# Patient Record
Sex: Male | Born: 1955 | Race: Black or African American | Hispanic: No | Marital: Single | Smoking: Current every day smoker
Health system: Southern US, Community
[De-identification: ages and names within clinical notes are randomized; demographics above are authoritative.]

## PROBLEM LIST (undated history)

## (undated) DIAGNOSIS — R0609 Other forms of dyspnea: Secondary | ICD-10-CM

## (undated) DIAGNOSIS — I10 Essential (primary) hypertension: Secondary | ICD-10-CM

## (undated) DIAGNOSIS — R0600 Dyspnea, unspecified: Secondary | ICD-10-CM

## (undated) DIAGNOSIS — N200 Calculus of kidney: Secondary | ICD-10-CM

## (undated) DIAGNOSIS — E7800 Pure hypercholesterolemia, unspecified: Secondary | ICD-10-CM

## (undated) DIAGNOSIS — F329 Major depressive disorder, single episode, unspecified: Secondary | ICD-10-CM

## (undated) DIAGNOSIS — G8929 Other chronic pain: Secondary | ICD-10-CM

## (undated) DIAGNOSIS — F32A Depression, unspecified: Secondary | ICD-10-CM

## (undated) DIAGNOSIS — E118 Type 2 diabetes mellitus with unspecified complications: Secondary | ICD-10-CM

## (undated) DIAGNOSIS — B1920 Unspecified viral hepatitis C without hepatic coma: Secondary | ICD-10-CM

## (undated) DIAGNOSIS — I209 Angina pectoris, unspecified: Secondary | ICD-10-CM

## (undated) DIAGNOSIS — M545 Low back pain, unspecified: Secondary | ICD-10-CM

## (undated) DIAGNOSIS — E1165 Type 2 diabetes mellitus with hyperglycemia: Secondary | ICD-10-CM

## (undated) DIAGNOSIS — I509 Heart failure, unspecified: Secondary | ICD-10-CM

## (undated) DIAGNOSIS — K7460 Unspecified cirrhosis of liver: Secondary | ICD-10-CM

## (undated) DIAGNOSIS — IMO0002 Reserved for concepts with insufficient information to code with codable children: Secondary | ICD-10-CM

## (undated) HISTORY — DX: Reserved for concepts with insufficient information to code with codable children: IMO0002

---

## 1977-08-22 HISTORY — PX: KIDNEY STONE SURGERY: SHX686

## 2013-03-05 ENCOUNTER — Encounter (HOSPITAL_COMMUNITY): Payer: Self-pay | Admitting: Family Medicine

## 2013-03-05 ENCOUNTER — Emergency Department (HOSPITAL_COMMUNITY): Payer: Medicare Other

## 2013-03-05 ENCOUNTER — Inpatient Hospital Stay (HOSPITAL_COMMUNITY)
Admission: EM | Admit: 2013-03-05 | Discharge: 2013-03-09 | DRG: 433 | Disposition: A | Discharge status: Home / Self Care | Payer: Medicare Other | Attending: Internal Medicine | Admitting: Internal Medicine

## 2013-03-05 DIAGNOSIS — F329 Major depressive disorder, single episode, unspecified: Secondary | ICD-10-CM | POA: Diagnosis present

## 2013-03-05 DIAGNOSIS — M549 Dorsalgia, unspecified: Secondary | ICD-10-CM | POA: Diagnosis present

## 2013-03-05 DIAGNOSIS — I10 Essential (primary) hypertension: Secondary | ICD-10-CM | POA: Diagnosis present

## 2013-03-05 DIAGNOSIS — R600 Localized edema: Secondary | ICD-10-CM

## 2013-03-05 DIAGNOSIS — F32A Depression, unspecified: Secondary | ICD-10-CM

## 2013-03-05 DIAGNOSIS — I129 Hypertensive chronic kidney disease with stage 1 through stage 4 chronic kidney disease, or unspecified chronic kidney disease: Secondary | ICD-10-CM | POA: Diagnosis present

## 2013-03-05 DIAGNOSIS — R1900 Intra-abdominal and pelvic swelling, mass and lump, unspecified site: Secondary | ICD-10-CM

## 2013-03-05 DIAGNOSIS — G894 Chronic pain syndrome: Secondary | ICD-10-CM | POA: Diagnosis present

## 2013-03-05 DIAGNOSIS — K7460 Unspecified cirrhosis of liver: Secondary | ICD-10-CM

## 2013-03-05 DIAGNOSIS — E7800 Pure hypercholesterolemia, unspecified: Secondary | ICD-10-CM | POA: Diagnosis present

## 2013-03-05 DIAGNOSIS — R079 Chest pain, unspecified: Secondary | ICD-10-CM

## 2013-03-05 DIAGNOSIS — R143 Flatulence: Secondary | ICD-10-CM

## 2013-03-05 DIAGNOSIS — E8809 Other disorders of plasma-protein metabolism, not elsewhere classified: Secondary | ICD-10-CM | POA: Diagnosis present

## 2013-03-05 DIAGNOSIS — K7030 Alcoholic cirrhosis of liver without ascites: Principal | ICD-10-CM | POA: Diagnosis present

## 2013-03-05 DIAGNOSIS — D638 Anemia in other chronic diseases classified elsewhere: Secondary | ICD-10-CM | POA: Diagnosis present

## 2013-03-05 DIAGNOSIS — R188 Other ascites: Secondary | ICD-10-CM

## 2013-03-05 DIAGNOSIS — B182 Chronic viral hepatitis C: Secondary | ICD-10-CM | POA: Diagnosis present

## 2013-03-05 DIAGNOSIS — Z23 Encounter for immunization: Secondary | ICD-10-CM

## 2013-03-05 DIAGNOSIS — G8929 Other chronic pain: Secondary | ICD-10-CM

## 2013-03-05 DIAGNOSIS — I5032 Chronic diastolic (congestive) heart failure: Secondary | ICD-10-CM | POA: Diagnosis present

## 2013-03-05 DIAGNOSIS — E118 Type 2 diabetes mellitus with unspecified complications: Secondary | ICD-10-CM

## 2013-03-05 DIAGNOSIS — R140 Abdominal distension (gaseous): Secondary | ICD-10-CM

## 2013-03-05 DIAGNOSIS — N179 Acute kidney failure, unspecified: Secondary | ICD-10-CM

## 2013-03-05 DIAGNOSIS — R0789 Other chest pain: Secondary | ICD-10-CM

## 2013-03-05 DIAGNOSIS — D649 Anemia, unspecified: Secondary | ICD-10-CM

## 2013-03-05 DIAGNOSIS — IMO0002 Reserved for concepts with insufficient information to code with codable children: Secondary | ICD-10-CM

## 2013-03-05 DIAGNOSIS — Z79899 Other long term (current) drug therapy: Secondary | ICD-10-CM

## 2013-03-05 DIAGNOSIS — F17200 Nicotine dependence, unspecified, uncomplicated: Secondary | ICD-10-CM | POA: Diagnosis present

## 2013-03-05 DIAGNOSIS — I509 Heart failure, unspecified: Secondary | ICD-10-CM | POA: Diagnosis present

## 2013-03-05 DIAGNOSIS — Z794 Long term (current) use of insulin: Secondary | ICD-10-CM

## 2013-03-05 DIAGNOSIS — E1165 Type 2 diabetes mellitus with hyperglycemia: Secondary | ICD-10-CM | POA: Diagnosis present

## 2013-03-05 DIAGNOSIS — N183 Chronic kidney disease, stage 3 unspecified: Secondary | ICD-10-CM | POA: Diagnosis present

## 2013-03-05 DIAGNOSIS — IMO0001 Reserved for inherently not codable concepts without codable children: Secondary | ICD-10-CM | POA: Diagnosis present

## 2013-03-05 HISTORY — DX: Unspecified viral hepatitis C without hepatic coma: B19.20

## 2013-03-05 HISTORY — DX: Depression, unspecified: F32.A

## 2013-03-05 HISTORY — DX: Heart failure, unspecified: I50.9

## 2013-03-05 HISTORY — DX: Low back pain: M54.5

## 2013-03-05 HISTORY — DX: Type 2 diabetes mellitus with hyperglycemia: E11.65

## 2013-03-05 HISTORY — DX: Other chronic pain: G89.29

## 2013-03-05 HISTORY — DX: Unspecified cirrhosis of liver: K74.60

## 2013-03-05 HISTORY — DX: Other forms of dyspnea: R06.09

## 2013-03-05 HISTORY — DX: Reserved for concepts with insufficient information to code with codable children: IMO0002

## 2013-03-05 HISTORY — DX: Pure hypercholesterolemia, unspecified: E78.00

## 2013-03-05 HISTORY — DX: Major depressive disorder, single episode, unspecified: F32.9

## 2013-03-05 HISTORY — DX: Essential (primary) hypertension: I10

## 2013-03-05 HISTORY — DX: Type 2 diabetes mellitus with unspecified complications: E11.8

## 2013-03-05 HISTORY — DX: Angina pectoris, unspecified: I20.9

## 2013-03-05 HISTORY — DX: Low back pain, unspecified: M54.50

## 2013-03-05 HISTORY — DX: Calculus of kidney: N20.0

## 2013-03-05 HISTORY — DX: Dyspnea, unspecified: R06.00

## 2013-03-05 LAB — URINALYSIS, ROUTINE W REFLEX MICROSCOPIC
Bilirubin Urine: NEGATIVE
Nitrite: NEGATIVE
Protein, ur: >300 mg/dL — AB
Urobilinogen, UA: 1 mg/dL (ref 0.0–1.0)

## 2013-03-05 LAB — HEPATIC FUNCTION PANEL
AST: 83 U/L — ABNORMAL HIGH (ref 0–37)
Bilirubin, Direct: <0.1 mg/dL (ref 0.0–0.3)
Total Bilirubin: 0.5 mg/dL (ref 0.3–1.2)

## 2013-03-05 LAB — POCT I-STAT TROPONIN I: Troponin i, poc: 0.09 ng/mL (ref 0.00–0.08)

## 2013-03-05 LAB — BASIC METABOLIC PANEL
BUN: 22 mg/dL (ref 6–23)
CO2: 24 mEq/L (ref 19–32)
Chloride: 105 mEq/L (ref 96–112)
GFR calc non Af Amer: 43 mL/min — ABNORMAL LOW (ref >90)
Glucose, Bld: 115 mg/dL — ABNORMAL HIGH (ref 70–99)
Potassium: 3.8 mEq/L (ref 3.5–5.1)
Sodium: 139 mEq/L (ref 135–145)

## 2013-03-05 LAB — CBC
HCT: 33.9 % — ABNORMAL LOW (ref 39.0–52.0)
Hemoglobin: 11.5 g/dL — ABNORMAL LOW (ref 13.0–17.0)
MCHC: 33.9 g/dL (ref 30.0–36.0)
RBC: 4.89 MIL/uL (ref 4.22–5.81)
WBC: 5.4 10*3/uL (ref 4.0–10.5)

## 2013-03-05 LAB — URINE MICROSCOPIC-ADD ON

## 2013-03-05 MED ORDER — ASPIRIN 325 MG PO TABS
325.0000 mg | ORAL_TABLET | Freq: Every day | ORAL | Status: DC
  Administered 2013-03-06 – 2013-03-09 (×4): 325 mg via ORAL
  Filled 2013-03-05 (×4): qty 1

## 2013-03-05 MED ORDER — INSULIN GLARGINE 100 UNIT/ML ~~LOC~~ SOLN
28.0000 [IU] | Freq: Every day | SUBCUTANEOUS | Status: DC
  Administered 2013-03-05 – 2013-03-07 (×3): 28 [IU] via SUBCUTANEOUS
  Filled 2013-03-05 (×4): qty 0.28

## 2013-03-05 MED ORDER — FUROSEMIDE 10 MG/ML IJ SOLN
80.0000 mg | Freq: Once | INTRAMUSCULAR | Status: AC
  Administered 2013-03-05: 80 mg via INTRAVENOUS
  Filled 2013-03-05: qty 8

## 2013-03-05 MED ORDER — ONDANSETRON HCL 4 MG PO TABS: 4.0000 mg | ORAL_TABLET | Freq: Four times a day (QID) | ORAL | Status: DC | PRN

## 2013-03-05 MED ORDER — ONDANSETRON HCL 4 MG/2ML IJ SOLN
4.0000 mg | Freq: Four times a day (QID) | INTRAMUSCULAR | Status: DC | PRN
  Administered 2013-03-06: 4 mg via INTRAVENOUS
  Filled 2013-03-05: qty 2

## 2013-03-05 MED ORDER — HYDRALAZINE HCL 20 MG/ML IJ SOLN
10.0000 mg | Freq: Four times a day (QID) | INTRAMUSCULAR | Status: DC | PRN
  Administered 2013-03-05 – 2013-03-08 (×4): 10 mg via INTRAVENOUS
  Filled 2013-03-05 (×5): qty 1

## 2013-03-05 MED ORDER — CARVEDILOL 25 MG PO TABS
25.0000 mg | ORAL_TABLET | Freq: Two times a day (BID) | ORAL | Status: DC
  Administered 2013-03-06 – 2013-03-09 (×5): 25 mg via ORAL
  Filled 2013-03-05 (×9): qty 1

## 2013-03-05 MED ORDER — FUROSEMIDE 10 MG/ML IJ SOLN
40.0000 mg | Freq: Three times a day (TID) | INTRAMUSCULAR | Status: DC
  Administered 2013-03-05 – 2013-03-06 (×2): 40 mg via INTRAVENOUS
  Filled 2013-03-05 (×5): qty 4

## 2013-03-05 MED ORDER — ASPIRIN 81 MG PO CHEW
324.0000 mg | CHEWABLE_TABLET | Freq: Once | ORAL | Status: AC
  Administered 2013-03-05: 324 mg via ORAL
  Filled 2013-03-05: qty 4

## 2013-03-05 MED ORDER — SPIRONOLACTONE 50 MG PO TABS
50.0000 mg | ORAL_TABLET | Freq: Every day | ORAL | Status: DC
  Administered 2013-03-06: 50 mg via ORAL
  Filled 2013-03-05 (×2): qty 1

## 2013-03-05 MED ORDER — INSULIN ASPART 100 UNIT/ML ~~LOC~~ SOLN
0.0000 [IU] | Freq: Three times a day (TID) | SUBCUTANEOUS | Status: DC
  Administered 2013-03-06: 3 [IU] via SUBCUTANEOUS
  Administered 2013-03-07: 1 [IU] via SUBCUTANEOUS

## 2013-03-05 MED ORDER — ACETAMINOPHEN 650 MG RE SUPP: 650.0000 mg | Freq: Four times a day (QID) | RECTAL | Status: DC | PRN

## 2013-03-05 MED ORDER — PNEUMOCOCCAL VAC POLYVALENT 25 MCG/0.5ML IJ INJ
0.5000 mL | INJECTION | INTRAMUSCULAR | Status: AC
  Administered 2013-03-06: 0.5 mL via INTRAMUSCULAR
  Filled 2013-03-05: qty 0.5

## 2013-03-05 MED ORDER — ACETAMINOPHEN 325 MG PO TABS
650.0000 mg | ORAL_TABLET | Freq: Four times a day (QID) | ORAL | Status: DC | PRN
  Administered 2013-03-06 – 2013-03-08 (×5): 650 mg via ORAL
  Filled 2013-03-05 (×5): qty 2

## 2013-03-05 MED ORDER — INSULIN ASPART 100 UNIT/ML ~~LOC~~ SOLN: 0.0000 [IU] | Freq: Every day | SUBCUTANEOUS | Status: DC

## 2013-03-05 MED ORDER — ENOXAPARIN SODIUM 40 MG/0.4ML ~~LOC~~ SOLN
40.0000 mg | SUBCUTANEOUS | Status: DC
  Administered 2013-03-05 – 2013-03-08 (×2): 40 mg via SUBCUTANEOUS
  Filled 2013-03-05 (×5): qty 0.4

## 2013-03-05 NOTE — H&P (Addendum)
Patient's PCP: Pcp Not In System, PCP in [place], has seen a provider in [place] area.  Chief Complaint: Chest pain and shortness of breath  History of Present Illness: Carl Browning is a 57 y.o. 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  Patient last saw his primary care physician in [place] in February of 2014.  He is visiting [place] because his father died, and he is managing his finances.  He reports that since February of 2014 he is noted increasing abdominal girth with increasing lower extremity edema.  Also over the last 6 months he has had chest pain and shortness of breath with exertion.  In the last 24 hours he has noted chest pain and shortness of breath even at rest.  Yesterday night even though he was seated he was having the symptoms, he was also complaining of being diaphoretic.  Denies any radiating pain to the left arm or jaw.  He became concerned as a result he presented to the emergency department for further evaluation.  In the ER he was found to be in renal failure with creatinine of 1.7, patient is not aware of having renal problems.  Hospitalist service was asked to admit the patient for further care and management.  He denies any fevers, chills, vomiting, abdominal pain, diarrhea, or no blood in stool.  Currently denies any chest pain.  Does admit to having intermittent episodes of generalized headaches and has noted some blurred vision.  Review of Systems: All systems reviewed with the patient and positive as per history of present illness, otherwise all other systems are negative.  Past Medical History  Diagnosis Date  . CHF (congestive heart failure)   . Hypertension   . DM (diabetes mellitus), type 2, uncontrolled with complications   . Cirrhosis   . Chronic back pain    History reviewed. No pertinent past surgical history. Family History   Problem Relation Age of Onset  . Heart attack Mother   . Prostate cancer Father    History   Social History  . Marital Status: Single    Spouse Name: N/A    Number of Children: N/A  . Years of Education: N/A   Occupational History  . Not on file.   Social History Main Topics  . Smoking status: Current Every Day Smoker -- 0.25 packs/day  . Smokeless tobacco: Not on file  . Alcohol Use: No     Comment: Used to drink alcohol heavyly, now only drinks occasionally for occasions.  . Drug Use: No  . Sexually Active: Not on file   Other Topics Concern  . Not on file   Social History Narrative  . No narrative on file   Allergies: Review of patient's allergies indicates no known allergies.  Home Meds: Prior to Admission medications   Medication Sig Start Date End Date Taking? Authorizing Provider  carvedilol (COREG) 25 MG tablet Take 25 mg by mouth 2 (two) times daily with a meal.   Yes Historical Provider, MD  furosemide (LASIX) 40 MG tablet Take 40 mg by mouth daily.   Yes Historical Provider, MD  insulin aspart (NOVOLOG) 100 UNIT/ML injection Inject 10 Units into the skin 3 (three) times daily with meals.   Yes Historical Provider, MD  insulin glargine (LANTUS) 100 UNIT/ML injection Inject 28 Units into the skin at bedtime.   Yes Historical Provider, MD  lisinopril (PRINIVIL,ZESTRIL) 20  MG tablet Take 20 mg by mouth 2 (two) times daily.   Yes Historical Provider, MD  methadone (DOLOPHINE) 10 MG tablet Take 10 mg by mouth every 8 (eight) hours.   Yes Historical Provider, MD  spironolactone (ALDACTONE) 50 MG tablet Take 50 mg by mouth daily.   Yes Historical Provider, MD    Physical Exam: Blood pressure 215/97, pulse 66, temperature 99.2 F (37.3 C), temperature source Oral, resp. rate 17, SpO2 100.00%. General: Awake, Oriented x3, No acute distress. HEENT: EOMI, Moist mucous membranes Neck: Supple CV: S1 and S2 Lungs: Clear to ascultation bilaterally Abdomen: Soft,  Nontender, distended, +bowel sounds. Ext: Good pulses.  2-3+ lower extremity edema. No clubbing or cyanosis noted. Neuro: Cranial Nerves II-XII grossly intact. Has 5/5 motor strength in upper and lower extremities.  Lab results:  Recent Labs  03/05/13 1426  NA 139  K 3.8  CL 105  CO2 24  GLUCOSE 115*  BUN 22  CREATININE 1.70*  CALCIUM 8.6   No results found for this basename: AST, ALT, ALKPHOS, BILITOT, PROT, ALBUMIN,  in the last 72 hours No results found for this basename: LIPASE, AMYLASE,  in the last 72 hours  Recent Labs  03/05/13 1426  WBC 5.4  HGB 11.5*  HCT 33.9*  MCV 69.3*  PLT 166   No results found for this basename: CKTOTAL, CKMB, CKMBINDEX, TROPONINI,  in the last 72 hours No components found with this basename: POCBNP,  No results found for this basename: DDIMER,  in the last 72 hours No results found for this basename: HGBA1C,  in the last 72 hours No results found for this basename: CHOL, HDL, LDLCALC, TRIG, CHOLHDL, LDLDIRECT,  in the last 72 hours No results found for this basename: TSH, T4TOTAL, FREET3, T3FREE, THYROIDAB,  in the last 72 hours No results found for this basename: VITAMINB12, FOLATE, FERRITIN, TIBC, IRON, RETICCTPCT,  in the last 72 hours Imaging results:  Dg Chest 2 View  03/05/2013   *RADIOLOGY REPORT*  Clinical Data: Chest pain  CHEST - 2 VIEW  Comparison: None.  Findings: Cardiomediastinal silhouette is unremarkable.  No acute infiltrate or pleural effusion.  No pulmonary edema.  Bony thorax is unremarkable.  IMPRESSION: No active disease.   Original Report Authenticated By: Natasha Mead, M.D.   Other results: EKG: Normal sinus rhythm with heart rate of 84.  Assessment & Plan by Problem: Chest tightness and shortness of breath Etiology unclear.  Admit the patient to telemetry and cycle cardiac enzymes rule the patient out for acute coronary syndrome.  Patient received a dose of aspirin in the emergency department which will be  continued.  Will get a 2-D echocardiogram for further evaluation.  EKG shows normal sinus rhythm with no ischemic findings.  If patient has persistent chest pain, may need a cardiology evaluation during hospital stay.  Suspect patient's chest tightness and shortness of breath may be related to abdominal distention and girth.  Abdominal distention with history of cirrhosis Will get an abdominal ultrasound in the morning.  If abdominal ultrasound shows ascites, he will need paracentesis for further evaluation.  Will check liver function tests.  Will also check hepatitis panel.  Obtain records from has primary care physician in hospital in [place].  Acute renal failure Uncertain if patient has underlying chronic kidney disease.  Continue to monitor.  Patient has history of nephrolithiasis ordered abdominal ultrasound to evaluate for any obstruction.  Check urine analysis and culture.  Monitor renal function as patient is being  diuresis with Lasix and spironolactone.  Bilateral lower extremity edema Likely related to cirrhosis.  Uncertain if he has diagnosis of congestive heart failure, will get 2-D echocardiogram for further evaluation.  Will get lower extremity Dopplers to rule out DVT.  Patient is being diuresis with Lasix 40 mg IV every 8 hours.  Uncontrolled hypertension Continue home carvedilol.  When necessary hydralazine for systolic blood pressure greater than 160.  Further titration of antihypertensive medications depending on patient's clinical course.  Anemia Likely due to chronic disease.  Check anemia panel in the morning.  Type 2 diabetes uncontrolled with complications Continue home Lantus.  Sliding scale insulin.  Diabetic diet.  Check hemoglobin A1c in the morning.  Chronic back pain Stable.  Has not taken his methadone for a while.  Continue to hold.  Prophylaxis Lovenox.  CODE STATUS Full code.  Disposition Admit the patient to telemetry as inpatient.  Time spent on  admission, talking to the patient, and coordinating care was: 60 mins.  Jong Rickman A, MD 03/05/2013, 8:04 PM

## 2013-03-05 NOTE — ED Notes (Signed)
Per pt sts chest pain x a few days and SOB. sts also that his abdomen is distended. sts burning in chest.

## 2013-03-05 NOTE — ED Provider Notes (Signed)
History    CSN: [id number] Arrival date & time 03/05/13  1416  First MD Initiated Contact with Patient 03/05/13 1517     Chief Complaint  Patient presents with  . Chest Pain   (Consider location/radiation/quality/duration/timing/severity/associated sxs/prior Treatment) HPI Comments: Carl Browning is a 57 y.o. Male who presents for evaluation of abdominal swelling, shortness of breath, and chest pain. Abdominal swelling has been present for greater than 6 months, and is worsening in the last several weeks. He saw his doctor about 2 weeks ago and was told to continue taking Lasix. He had cardiac evaluation about 6 months ago in [place] and was told that his heart was enlarged. He has chronic low back pain and takes methadone for that. He ran out of the methadone 2 weeks ago. He an episode of chest pain 2 weeks ago. That resolved spontaneously. He does not recall having a cardiac catheterization or any specific cardiac stress testing. He has mild dyspnea when lying supine. He also has dyspnea with walking. He denies abdominal pain. His chest pain as a tight feeling in rate at 7-8/10. It started at midnight, after he laid down to go to sleep.  It was present all through the night and did not allow him to sleep fully. No other known modifying factors.  Patient is a 57 y.o. male presenting with chest pain. The history is provided by the patient and a relative.  Chest Pain  Past Medical History  Diagnosis Date  . CHF (congestive heart failure)   . Hypertension   . Diabetes mellitus without complication    History reviewed. No pertinent past surgical history. History reviewed. No pertinent family history. History  Substance Use Topics  . Smoking status: Never Smoker   . Smokeless tobacco: Not on file  . Alcohol Use: No    Review of Systems  Cardiovascular: Positive for chest pain.  All other systems reviewed and are negative.    Allergies  Review of patient's allergies indicates no known  allergies.  Home Medications   Current Outpatient Rx  Name  Route  Sig  Dispense  Refill  . carvedilol (COREG) 25 MG tablet   Oral   Take 25 mg by mouth 2 (two) times daily with a meal.         . furosemide (LASIX) 40 MG tablet   Oral   Take 40 mg by mouth daily.         . insulin aspart (NOVOLOG) 100 UNIT/ML injection   Subcutaneous   Inject 10 Units into the skin 3 (three) times daily with meals.         . insulin glargine (LANTUS) 100 UNIT/ML injection   Subcutaneous   Inject 28 Units into the skin at bedtime.         Marland Kitchen lisinopril (PRINIVIL,ZESTRIL) 20 MG tablet   Oral   Take 20 mg by mouth 2 (two) times daily.         . methadone (DOLOPHINE) 10 MG tablet   Oral   Take 10 mg by mouth every 8 (eight) hours.         Marland Kitchen spironolactone (ALDACTONE) 50 MG tablet   Oral   Take 50 mg by mouth daily.          BP 174/96  Pulse 64  Temp(Src) 99.2 F (37.3 C) (Oral)  Resp 19  SpO2 100% Physical Exam  Nursing note and vitals reviewed. Constitutional: He is oriented to person, place, and time. He appears well-developed.  Appears older than stated age  HENT:  Head: Normocephalic and atraumatic.  Right Ear: External ear normal.  Left Ear: External ear normal.  Eyes: Conjunctivae and EOM are normal. Pupils are equal, round, and reactive to light. Right eye exhibits no discharge. Left eye exhibits no discharge. No scleral icterus.  Neck: Normal range of motion and phonation normal. Neck supple.  Cardiovascular: Normal rate, regular rhythm, normal heart sounds and intact distal pulses.   Pulmonary/Chest: Effort normal. He has rales (Bilateral bases). He exhibits no bony tenderness.  Abdominal: Soft. Normal appearance. He exhibits distension. He exhibits no mass. There is no tenderness. There is no guarding.  Musculoskeletal: Normal range of motion. He exhibits edema (2-3+ bilateral lower extemities).  Neurological: He is alert and oriented to person, place, and  time. He has normal strength. No cranial nerve deficit or sensory deficit. He exhibits normal muscle tone. Coordination normal.  Skin: Skin is warm, dry and intact.  Psychiatric: He has a normal mood and affect. His behavior is normal. Judgment and thought content normal.    ED Course  Procedures (including critical care time)  Medications  aspirin chewable tablet 324 mg (324 mg Oral Given 03/05/13 1532)  furosemide (LASIX) injection 80 mg (80 mg Intravenous Given 03/05/13 1814)    Patient Vitals for the past 24 hrs:  BP Temp Temp src Pulse Resp SpO2  03/05/13 1900 174/96 mmHg - - 64 19 100 %  03/05/13 1845 197/87 mmHg - - 70 14 100 %  03/05/13 1830 220/108 mmHg - - 70 19 100 %  03/05/13 1815 201/92 mmHg - - 66 18 100 %  03/05/13 1800 184/84 mmHg - - 67 16 100 %  03/05/13 1745 199/92 mmHg - - 65 11 99 %  03/05/13 1730 205/101 mmHg - - 63 13 99 %  03/05/13 1645 179/87 mmHg - - 59 16 100 %  03/05/13 1630 184/80 mmHg - - 61 16 100 %  03/05/13 1615 194/88 mmHg - - 64 19 99 %  03/05/13 1600 185/93 mmHg - - 60 12 100 %  03/05/13 1545 201/91 mmHg - - 68 14 99 %  03/05/13 1535 203/97 mmHg - - 68 17 100 %  03/05/13 1425 148/85 mmHg 99.2 F (37.3 C) Oral 87 28 99 %      Date: 03/05/13  Rate: 84  Rhythm: normal sinus rhythm  QRS Axis: normal  PR and QT Intervals: normal  ST/T Wave abnormalities: normal  PR and QRS Conduction Disutrbances:none  Narrative Interpretation:   Old EKG Reviewed: none available  7:17 PM-Consult complete with Dr. Betti Cruz. Patient case explained and discussed. He agrees to admit patient for further evaluation and treatment. Call ended at 1927  Labs Reviewed  CBC - Abnormal; Notable for the following:    Hemoglobin 11.5 (*)    HCT 33.9 (*)    MCV 69.3 (*)    MCH 23.5 (*)    All other components within normal limits  BASIC METABOLIC PANEL - Abnormal; Notable for the following:    Glucose, Bld 115 (*)    Creatinine, Ser 1.70 (*)    GFR calc non Af Amer 43  (*)    GFR calc Af Amer 50 (*)    All other components within normal limits  PRO B NATRIURETIC PEPTIDE - Abnormal; Notable for the following:    Pro B Natriuretic peptide (BNP) 1147.0 (*)    All other components within normal limits  POCT I-STAT TROPONIN I - Abnormal; Notable  for the following:    Troponin i, poc 0.09 (*)    All other components within normal limits  HEPATIC FUNCTION PANEL  POCT I-STAT TROPONIN I   Dg Chest 2 View  03/05/2013   *RADIOLOGY REPORT*  Clinical Data: Chest pain  CHEST - 2 VIEW  Comparison: None.  Findings: Cardiomediastinal silhouette is unremarkable.  No acute infiltrate or pleural effusion.  No pulmonary edema.  Bony thorax is unremarkable.  IMPRESSION: No active disease.   Original Report Authenticated By: Natasha Mead, M.D.   1. Chest pain   2. Abdominal swelling   3. Ascites     MDM  Nonspecific chest pain with abdominal pain and swelling. Chest pain is nonspecific and may be related to his ascites is secondary to his cirrhosis. Troponin x2 are borderline positive but unchanged. Doubt ACS, PE, or pneumonia. He has mild anemia. BNP elevation, is nonspecific.  Plan: Admit  Flint Melter, MD 03/05/13 [phone]

## 2013-03-05 NOTE — ED Notes (Signed)
Report given to floor.  Pt transported via stretcher and monitor to floor.  Nadn. bp is decreased now.

## 2013-03-06 ENCOUNTER — Inpatient Hospital Stay (HOSPITAL_COMMUNITY): Payer: Medicare Other

## 2013-03-06 DIAGNOSIS — R188 Other ascites: Secondary | ICD-10-CM

## 2013-03-06 DIAGNOSIS — M7989 Other specified soft tissue disorders: Secondary | ICD-10-CM

## 2013-03-06 DIAGNOSIS — I517 Cardiomegaly: Secondary | ICD-10-CM

## 2013-03-06 DIAGNOSIS — R0602 Shortness of breath: Secondary | ICD-10-CM

## 2013-03-06 LAB — CBC
MCH: 23.2 pg — ABNORMAL LOW (ref 26.0–34.0)
Platelets: 137 10*3/uL — ABNORMAL LOW (ref 150–400)
RBC: 4.69 MIL/uL (ref 4.22–5.81)
WBC: 5.6 10*3/uL (ref 4.0–10.5)

## 2013-03-06 LAB — GLUCOSE, CAPILLARY
Glucose-Capillary: 207 mg/dL — ABNORMAL HIGH (ref 70–99)
Glucose-Capillary: 117 mg/dL — ABNORMAL HIGH (ref 70–99)
Glucose-Capillary: 158 mg/dL — ABNORMAL HIGH (ref 70–99)

## 2013-03-06 LAB — RETICULOCYTES: RBC.: 4.69 MIL/uL (ref 4.22–5.81)

## 2013-03-06 LAB — IRON AND TIBC
Saturation Ratios: 16 % — ABNORMAL LOW (ref 20–55)
UIBC: 244 ug/dL (ref 125–400)

## 2013-03-06 LAB — COMPREHENSIVE METABOLIC PANEL
Alkaline Phosphatase: 108 U/L (ref 39–117)
BUN: 22 mg/dL (ref 6–23)
Calcium: 8.3 mg/dL — ABNORMAL LOW (ref 8.4–10.5)
Creatinine, Ser: 1.7 mg/dL — ABNORMAL HIGH (ref 0.50–1.35)
GFR calc Af Amer: 50 mL/min — ABNORMAL LOW (ref >90)
Glucose, Bld: 88 mg/dL (ref 70–99)
Total Protein: 6 g/dL (ref 6.0–8.3)

## 2013-03-06 LAB — HEMOGLOBIN A1C
Hgb A1c MFr Bld: 5.6 % (ref <5.7)
Mean Plasma Glucose: 114 mg/dL (ref <117)

## 2013-03-06 LAB — PROTIME-INR
INR: 1.02 (ref 0.00–1.49)
Prothrombin Time: 13.2 seconds (ref 11.6–15.2)

## 2013-03-06 LAB — FOLATE: Folate: 11.9 ng/mL

## 2013-03-06 MED ORDER — METHADONE HCL 10 MG PO TABS
10.0000 mg | ORAL_TABLET | Freq: Two times a day (BID) | ORAL | Status: DC
  Administered 2013-03-06 – 2013-03-09 (×6): 10 mg via ORAL
  Filled 2013-03-06 (×6): qty 1

## 2013-03-06 MED ORDER — FUROSEMIDE 10 MG/ML IJ SOLN
40.0000 mg | Freq: Two times a day (BID) | INTRAMUSCULAR | Status: DC
  Filled 2013-03-06 (×2): qty 4

## 2013-03-06 NOTE — Progress Notes (Signed)
VASCULAR LAB PRELIMINARY  PRELIMINARY  PRELIMINARY  PRELIMINARY  Bilateral lower extremity venous duplex  completed.    Preliminary report:  Bilateral:  No evidence of DVT, superficial thrombosis, or Baker's Cyst.    Kamsiyochukwu Spickler, RVT 03/06/2013, 11:32 AM

## 2013-03-06 NOTE — Progress Notes (Signed)
PATIENT DETAILS Name: Carl Browning Age: 57 y.o. Sex: male Date of Birth: 08-Jul-1956 Admit Date: 03/05/2013 Admitting Physician Cristal Ford, MD PCP:Pcp Not In System  Subjective: No more chest pain, did have mild headache earlier this am-this has now resolved. Claims he had alcoholic cirrhosis.  Assessment/Plan: Principal Problem:   Chest tightness -resolved -troponins X 3 neg -Echo- no wall motion wall abnormalities, Doppler's neg for DVT. Without hypoxia or tachycardia-low suspicion for PE. Ambulated in room today-with no recurrence of pain -doubt this was cardiac-suspect further work up can be done in the outpatient setting -change ASA to 81 mg  Active Problems: Ascites -continue with diuretics -likely 2/2 to underlying cirrhosis -claims baseline weight approx 220 lbs-weight on admission 247 lbs -decrease lasix to 40 mg BID -continue with Aldactone -will order for paracentesis in am  Renal Failure -not sure whether this acute or chronic -renal function stable compared to admit -monitor lytes -will try and get records from his physician's in [place]-he does not recollect their names-he will ask his family members to get [place] the names    DM (diabetes mellitus), type 2 -A1C 5.6 -CBG's stable -c/w Lantus 28 units daily, c/w SSI    Cirrhosis -claims to have a hx of underlying ETOH cirrhosis  Lower Ext Edema -diuresis underway with Lasix, aldactone -related to ascites and hypoalbuminemia  Diastolic CHF -compensated-suspect ascites and edema are from cirrhosis -continue with Coreg and Lasix -daily weights  -strict I&O's  HTN -c/w Coreg, Lasix and Aldactone  Chronic Pain Syndrome -hx of chronic back pain -claims to be on Methadone 10 mg TID for last 14 yrs-resume at BID dosing -obtain records from Dr Ree Kida in [facility]-he last saw him in March of this year   Disposition: Remain inpatient  DVT Prophylaxis: Prophylactic Lovenox   Code Status: Full code    Family Communication None  Procedures:  None  CONSULTS:  None   MEDICATIONS: Scheduled Meds: . aspirin  325 mg Oral Daily  . carvedilol  25 mg Oral BID WC  . enoxaparin (LOVENOX) injection  40 mg Subcutaneous Q24H  . furosemide  40 mg Intravenous Q8H  . insulin aspart  0-5 Units Subcutaneous QHS  . insulin aspart  0-9 Units Subcutaneous TID WC  . insulin glargine  28 Units Subcutaneous QHS  . spironolactone  50 mg Oral Daily   Continuous Infusions:  PRN Meds:.acetaminophen, acetaminophen, hydrALAZINE, ondansetron (ZOFRAN) IV, ondansetron  Antibiotics: Anti-infectives   None       PHYSICAL EXAM: Vital signs in last 24 hours: Filed Vitals:   03/06/13 0114 03/06/13 0510 03/06/13 0659 03/06/13 1540  BP: 169/84 176/82 141/78 151/76  Pulse:  63 72 54  Temp:  98.4 F (36.9 C)  98.3 F (36.8 C)  TempSrc:  Oral  Oral  Resp:  18  18  Height:      Weight:  110.95 kg (244 lb 9.6 oz)    SpO2:  99% 99% 96%    Weight change:  Filed Weights   03/05/13 2118 03/06/13 0510  Weight: 112.311 kg (247 lb 9.6 oz) 110.95 kg (244 lb 9.6 oz)   Body mass index is 34.13 kg/(m^2).   Gen Exam: Awake and alert with clear speech.   Neck: Supple, No JVD.   Chest: B/L Clear.   CVS: S1 S2 Regular, no murmurs.  Abdomen: soft, BS +, non tender,  Distended with shifting dullness. Extremities: 2+ edema, lower extremities warm to touch. Neurologic: Non Focal.   Skin: No Rash.  Wounds: N/A.    Intake/Output from previous day:  Intake/Output Summary (Last 24 hours) at 03/06/13 1553 Last data filed at 03/06/13 1541  Gross per 24 hour  Intake     60 ml  Output    925 ml  Net   -865 ml     LAB RESULTS: CBC  Recent Labs Lab 03/05/13 1426 03/06/13 0210  WBC 5.4 5.6  HGB 11.5* 10.9*  HCT 33.9* 33.0*  PLT 166 137*  MCV 69.3* 70.4*  MCH 23.5* 23.2*  MCHC 33.9 33.0  RDW 15.2 15.5    Chemistries   Recent Labs Lab 03/05/13 1426 03/06/13 0210  NA 139 141  K 3.8 3.5   CL 105 106  CO2 24 30  GLUCOSE 115* 88  BUN 22 22  CREATININE 1.70* 1.70*  CALCIUM 8.6 8.3*    CBG:  Recent Labs Lab 03/05/13 2156 03/06/13 0828 03/06/13 1228  GLUCAP 129* 82 207*    GFR Estimated Creatinine Clearance: 61.5 ml/min (by C-G formula based on Cr of 1.7).  Coagulation profile  Recent Labs Lab 03/06/13 0210  INR 1.02    Cardiac Enzymes  Recent Labs Lab 03/05/13 2029 03/06/13 0210  TROPONINI <0.30 <0.30    No components found with this basename: POCBNP,  No results found for this basename: DDIMER,  in the last 72 hours  Recent Labs  03/06/13 0210  HGBA1C 5.6   No results found for this basename: CHOL, HDL, LDLCALC, TRIG, CHOLHDL, LDLDIRECT,  in the last 72 hours No results found for this basename: TSH, T4TOTAL, FREET3, T3FREE, THYROIDAB,  in the last 72 hours  Recent Labs  03/06/13 0210  VITAMINB12 655  FOLATE 11.9  FERRITIN 67  TIBC 292  IRON 48  RETICCTPCT 1.5   No results found for this basename: LIPASE, AMYLASE,  in the last 72 hours  Urine Studies No results found for this basename: UACOL, UAPR, USPG, UPH, UTP, UGL, UKET, UBIL, UHGB, UNIT, UROB, ULEU, UEPI, UWBC, URBC, UBAC, CAST, CRYS, UCOM, BILUA,  in the last 72 hours  MICROBIOLOGY: No results found for this or any previous visit (from the past 240 hour(s)).  RADIOLOGY STUDIES/RESULTS: Dg Chest 2 View  03/05/2013   *RADIOLOGY REPORT*  Clinical Data: Chest pain  CHEST - 2 VIEW  Comparison: None.  Findings: Cardiomediastinal silhouette is unremarkable.  No acute infiltrate or pleural effusion.  No pulmonary edema.  Bony thorax is unremarkable.  IMPRESSION: No active disease.   Original Report Authenticated By: Natasha Mead, M.D.   US Abdomen Complete  03/06/2013   *RADIOLOGY REPORT*  Clinical Data:  Cirrhosis and acute renal failure.  COMPLETE ABDOMINAL ULTRASOUND  Comparison:  None.  Findings:  Gallbladder:  Numerous mobile echogenic shadowing gallstones are present.  Wall  thickness is within normal limits at 3 mm.  There is no sonographic Murphy's sign.  Common bile duct:  The common bile duct is at the limits of normal in diameter at 6.3 mm.  Liver:  The liver is shrunken with multiple echogenic foci.  No discrete mass lesion is present.  The edges are nodular, compatible with end-stage cirrhosis.  IVC:  Appears normal.  Pancreas:  Although the pancreas is difficult to visualize in its entirety, no focal pancreatic abnormality is identified.  Spleen:  The spleen is mildly enlarged, measuring 1.6 cm in maximal length.  No focal lesions are present.  The right kidney is of normal size and echo texture.  It measures 12.5 cm maximally.  Multiple shadowing  calcifications are present, the largest measuring 7 mm.  There is no evidence for obstruction.  Right Kidney:  Measures  Left Kidney:  The left kidney is of normal size and echotexture measuring 12.1 cm maximally.  Multiple calcifications are noted without evidence for obstruction.  The largest stone is 8 mm.  Abdominal aorta:  The distal aorta is not visualized.  Minimal aneurysmal dilation is evident with maximal measured diameter of 3.1 cm.  Moderate free fluid is present within the peritoneal cavity.  IMPRESSION:  1.  Cirrhotic changes of the liver with scattered increased foci of echogenicity, overall decreased size, and a nodular edge. 2.  Moderate abdominal ascites. 3.  Enlarged spleen without a discrete lesion. 4.  Bilateral nephrolithiasis without obstruction. 5.  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   Original Report Authenticated By: Marin Roberts, M.D.    Jeoffrey Massed, MD  [facility] Pager:[phone]  If 7PM-7AM, please contact night-coverage [email or web address] Password [email or web address] 03/06/2013, 3:53 PM   LOS: 1 day

## 2013-03-06 NOTE — Progress Notes (Signed)
  Echocardiogram 2D Echocardiogram has been performed.  Carl Browning 03/06/2013, 11:52 AM

## 2013-03-06 NOTE — Care Management Note (Signed)
    Page 1 of 1   03/11/2013     11:13:30 AM   CARE MANAGEMENT NOTE 03/11/2013  Patient:  Carl Browning, Carl Browning   Account Number:  [id number]  Date Initiated:  03/06/2013  Documentation initiated by:  Letha Cape  Subjective/Objective Assessment:   dx chest tightness, ? ascites, hypertension, cirrhosis, ? chf  admit- lives with son right now, but is from [place] and wants to go back.     Action/Plan:   Anticipated DC Date:  03/09/2013   Anticipated DC Plan:  HOME/SELF CARE      DC Planning Services  CM consult      Choice offered to / List presented to:             Status of service:  Completed, signed off Medicare Important Message given?   (If response is "NO", the following Medicare IM given date fields will be blank) Date Medicare IM given:   Date Additional Medicare IM given:    Discharge Disposition:  HOME/SELF CARE  Per UR Regulation:  Reviewed for med. necessity/level of care/duration of stay  If discussed at Long Length of Stay Meetings, dates discussed:    Comments:  03/11/13 11:07 Letha Cape RN, BSN [phone] patient dc to home.  03/06/13 15:40 Letha Cape RN, BSN [phone] patient lives with son, but he is from [place], patient has medication coverage thru medicare.  Patient has transportation at dc.  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  Informed MD of this information.

## 2013-03-06 NOTE — Progress Notes (Signed)
Pt arrived from ED via stretcher. Pt was oriented to room and safety video has been watched. Pt has an elevated bp. RN will continue to monitor pt. Pt was just given 10 mg of hydralazine IV in ED. Pt's abdominal girth measuring about 51

## 2013-03-07 LAB — BASIC METABOLIC PANEL
CO2: 28 mEq/L (ref 19–32)
Chloride: 106 mEq/L (ref 96–112)
Creatinine, Ser: 2 mg/dL — ABNORMAL HIGH (ref 0.50–1.35)
Sodium: 140 mEq/L (ref 135–145)

## 2013-03-07 LAB — GLUCOSE, CAPILLARY: Glucose-Capillary: 100 mg/dL — ABNORMAL HIGH (ref 70–99)

## 2013-03-07 LAB — URINE CULTURE: Culture: NO GROWTH

## 2013-03-07 LAB — HEPATITIS PANEL, ACUTE: HCV Ab: REACTIVE — AB

## 2013-03-07 MED ORDER — SERTRALINE HCL 25 MG PO TABS
25.0000 mg | ORAL_TABLET | Freq: Every day | ORAL | Status: DC
  Administered 2013-03-07 – 2013-03-09 (×3): 25 mg via ORAL
  Filled 2013-03-07 (×3): qty 1

## 2013-03-07 NOTE — Progress Notes (Signed)
After giving pt morning medications, pt began to talk about feet being in pain and stated "I can't live with it anymore."  Inquired if pt was having thoughts of suicide.  Pt stated "I don't want to be a burden to my family.  I've seen friends lose their limbs and their legs.  And the pain doesn't get better."  Asked if pt had attempted suicide in the past.  Pt stated "I tried once.  I didn't take my medications once and let my blood pressure get high, but it didn't work."  Informed Dr. Jerral Ralph.  Will continue to monitor.

## 2013-03-07 NOTE — Progress Notes (Signed)
PATIENT DETAILS Name: Carl Browning Age: 57 y.o. Sex: male Date of Birth: 02/29/1956 Admit Date: 03/05/2013 Admitting Physician Cristal Ford, MD PCP:Pcp Not In System  Subjective: No more chest pain, did have mild headache earlier this am-this has now resolved. Claims he had alcoholic cirrhosis.  Assessment/Plan: Principal Problem:   Chest tightness -resolved -troponins X 3 neg -Echo- no wall motion wall abnormalities, Doppler's neg for DVT. Without hypoxia or tachycardia-low suspicion for PE. Ambulated in room today-with no recurrence of pain -doubt this was cardiac-suspect further work up can be done in the outpatient setting -change ASA to 81 mg  Active Problems: Ascites -increased distention on 7/17 -2/2 to underlying cirrhosis -claims baseline weight approx 220 lbs-weight 7/17 is 249 lbs -lasix/aldactone discontinued due to increasing creatinine -will order for paracentesis in am -2 gm sodium diet  Renal Failure -not sure whether this acute or chronic -renal function worsening (creatinine at 2.0 on 7/17) -monitor lytes -will try and get records from his physician's in [place]-he does not recollect their names-he will ask his family members to get [place] the names.  PCP in [facility] is Dr. Garner Nash per Pt.    DM (diabetes mellitus), type 2 -A1C 5.6 -CBG's stable -c/w Lantus 28 units daily, c/w SSI    Cirrhosis -claims to have a hx of underlying ETOH cirrhosis  Lower Ext Edema -improved with diuresis -related to ascites and hypoalbuminemia  Diastolic CHF -compensated-suspect ascites and edema are from cirrhosis -continue with Coreg  -daily weights  -strict I&O's  HTN -c/w Coreg, Lasix and Aldactone  Chronic Pain Syndrome -hx of chronic back pain -claims to be on Methadone 10 mg TID for last 14 yrs-resume at BID dosing -obtain records from Dr Ree Kida in [facility]-he last saw him in March of this year  Suicidal Ideations -patient discussing  previous attempts to commit suicide with RN, comments - I don't want to live anymore -Psych consult - NP notes depression, but SI are unlikely.  Offered inpatient therapy - patient refused.  Recommended SSRI and outpatient therapy.  Disposition: Remain inpatient  DVT Prophylaxis: Prophylactic Lovenox   Code Status: Full code   Family Communication None  Procedures:  None  CONSULTS:  None   MEDICATIONS: Scheduled Meds: . aspirin  325 mg Oral Daily  . carvedilol  25 mg Oral BID WC  . enoxaparin (LOVENOX) injection  40 mg Subcutaneous Q24H  . insulin aspart  0-5 Units Subcutaneous QHS  . insulin aspart  0-9 Units Subcutaneous TID WC  . insulin glargine  28 Units Subcutaneous QHS  . methadone  10 mg Oral Q12H  . sertraline  25 mg Oral Daily   Continuous Infusions:  PRN Meds:.acetaminophen, acetaminophen, hydrALAZINE, ondansetron (ZOFRAN) IV, ondansetron  Antibiotics: Anti-infectives   None       PHYSICAL EXAM: Vital signs in last 24 hours: Filed Vitals:   03/06/13 1540 03/07/13 0500 03/07/13 0617 03/07/13 0854  BP: 151/76  169/78 180/78  Pulse: 54  61 63  Temp: 98.3 F (36.8 C)  100 F (37.8 C)   TempSrc: Oral  Oral   Resp: 18  18   Height:      Weight:  113.49 kg (250 lb 3.2 oz) 113.172 kg (249 lb 8 oz)   SpO2: 96%  98%     Weight change: 1.179 kg (2 lb 9.6 oz) Filed Weights   03/06/13 0510 03/07/13 0500 03/07/13 0617  Weight: 110.95 kg (244 lb 9.6 oz) 113.49 kg (250 lb 3.2 oz)  113.172 kg (249 lb 8 oz)   Body mass index is 34.81 kg/(m^2).   Gen Exam: Awake and alert with clear speech.   Neck: Supple, No JVD.   Chest: B/L Clear.   CVS: S1 S2 Regular, no murmurs.  Abdomen: firm, distended,  BS +, non tender,  Distended with shifting dullness. Extremities: 2+ edema, lower extremities warm to touch. Neurologic: Non Focal.   Skin: No Rash.   Wounds: N/A.    Intake/Output from previous day:  Intake/Output Summary (Last 24 hours) at 03/07/13  1216 Last data filed at 03/07/13 0600  Gross per 24 hour  Intake    180 ml  Output      0 ml  Net    180 ml     LAB RESULTS: CBC  Recent Labs Lab 03/05/13 1426 03/06/13 0210  WBC 5.4 5.6  HGB 11.5* 10.9*  HCT 33.9* 33.0*  PLT 166 137*  MCV 69.3* 70.4*  MCH 23.5* 23.2*  MCHC 33.9 33.0  RDW 15.2 15.5    Chemistries   Recent Labs Lab 03/05/13 1426 03/06/13 0210 03/07/13 0500  NA 139 141 140  K 3.8 3.5 3.8  CL 105 106 106  CO2 24 30 28   GLUCOSE 115* 88 115*  BUN 22 22 25*  CREATININE 1.70* 1.70* 2.00*  CALCIUM 8.6 8.3* 8.1*    CBG:  Recent Labs Lab 03/06/13 1228 03/06/13 1704 03/06/13 2236 03/07/13 0759 03/07/13 1155  GLUCAP 207* 117* 158* 100* 129*     Coagulation profile  Recent Labs Lab 03/06/13 0210  INR 1.02    Cardiac Enzymes  Recent Labs Lab 03/05/13 2029 03/06/13 0210  TROPONINI <0.30 <0.30     Recent Labs  03/06/13 0210  HGBA1C 5.6    Recent Labs  03/06/13 0210  VITAMINB12 655  FOLATE 11.9  FERRITIN 67  TIBC 292  IRON 48  RETICCTPCT 1.5    MICROBIOLOGY: Recent Results (from the past 240 hour(s))  URINE CULTURE     Status: None   Collection Time    03/05/13  8:00 PM      Result Value Range Status   Specimen Description URINE, CLEAN CATCH   Final   Special Requests NONE   Final   Culture  Setup Time 03/06/2013 06:38   Final   Colony Count NO GROWTH   Final   Culture NO GROWTH   Final   Report Status 03/07/2013 FINAL   Final    RADIOLOGY STUDIES/RESULTS: Dg Chest 2 View  03/05/2013   *RADIOLOGY REPORT*  Clinical Data: Chest pain  CHEST - 2 VIEW  Comparison: None.  Findings: Cardiomediastinal silhouette is unremarkable.  No acute infiltrate or pleural effusion.  No pulmonary edema.  Bony thorax is unremarkable.  IMPRESSION: No active disease.   Original Report Authenticated By: Natasha Mead, M.D.   US Abdomen Complete  03/06/2013   *RADIOLOGY REPORT*  Clinical Data:  Cirrhosis and acute renal failure.   COMPLETE ABDOMINAL ULTRASOUND  Comparison:  None.  Findings:  Gallbladder:  Numerous mobile echogenic shadowing gallstones are present.  Wall thickness is within normal limits at 3 mm.  There is no sonographic Murphy's sign.  Common bile duct:  The common bile duct is at the limits of normal in diameter at 6.3 mm.  Liver:  The liver is shrunken with multiple echogenic foci.  No discrete mass lesion is present.  The edges are nodular, compatible with end-stage cirrhosis.  IVC:  Appears normal.  Pancreas:  Although the pancreas  is difficult to visualize in its entirety, no focal pancreatic abnormality is identified.  Spleen:  The spleen is mildly enlarged, measuring 1.6 cm in maximal length.  No focal lesions are present.  The right kidney is of normal size and echo texture.  It measures 12.5 cm maximally.  Multiple shadowing calcifications are present, the largest measuring 7 mm.  There is no evidence for obstruction.  Right Kidney:  Measures  Left Kidney:  The left kidney is of normal size and echotexture measuring 12.1 cm maximally.  Multiple calcifications are noted without evidence for obstruction.  The largest stone is 8 mm.  Abdominal aorta:  The distal aorta is not visualized.  Minimal aneurysmal dilation is evident with maximal measured diameter of 3.1 cm.  Moderate free fluid is present within the peritoneal cavity.  IMPRESSION:  1.  Cirrhotic changes of the liver with scattered increased foci of echogenicity, overall decreased size, and a nodular edge. 2.  Moderate abdominal ascites. 3.  Enlarged spleen without a discrete lesion. 4.  Bilateral nephrolithiasis without obstruction. 5.  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   Original Report Authenticated By: Marin Roberts, M.D.    [facility] Pager:[phone]  If 7PM-7AM, please contact night-coverage [email or web address] Password [email or web address] 03/07/2013, 12:16 PM   LOS: 2 days      Attending Patient seen and examined, agree with the above assessment and plan. Hold diuretics today, and recheck lytes in am-suspect needs a paracentesis tomorrow. Seen by psych this am-not actively suicidal-started on Zoloft  S Abaigeal Moomaw
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
## 2013-03-08 ENCOUNTER — Inpatient Hospital Stay (HOSPITAL_COMMUNITY): Payer: Medicare Other

## 2013-03-08 DIAGNOSIS — F329 Major depressive disorder, single episode, unspecified: Secondary | ICD-10-CM

## 2013-03-08 DIAGNOSIS — G8929 Other chronic pain: Secondary | ICD-10-CM

## 2013-03-08 DIAGNOSIS — M549 Dorsalgia, unspecified: Secondary | ICD-10-CM

## 2013-03-08 LAB — GLUCOSE, CAPILLARY
Glucose-Capillary: 100 mg/dL — ABNORMAL HIGH (ref 70–99)
Glucose-Capillary: 113 mg/dL — ABNORMAL HIGH (ref 70–99)
Glucose-Capillary: 98 mg/dL (ref 70–99)

## 2013-03-08 LAB — ALBUMIN, FLUID (OTHER): Albumin, Fluid: 0.6 g/dL

## 2013-03-08 LAB — BODY FLUID CELL COUNT WITH DIFFERENTIAL
Eos, Fluid: 0 %
Lymphs, Fluid: 64 %
Neutrophil Count, Fluid: 6 % (ref 0–25)

## 2013-03-08 LAB — BASIC METABOLIC PANEL
BUN: 28 mg/dL — ABNORMAL HIGH (ref 6–23)
Calcium: 8.3 mg/dL — ABNORMAL LOW (ref 8.4–10.5)
GFR calc non Af Amer: 38 mL/min — ABNORMAL LOW (ref >90)
Glucose, Bld: 78 mg/dL (ref 70–99)
Sodium: 139 mEq/L (ref 135–145)

## 2013-03-08 MED ORDER — FUROSEMIDE 40 MG PO TABS
40.0000 mg | ORAL_TABLET | Freq: Every day | ORAL | Status: DC
  Administered 2013-03-08: 40 mg via ORAL
  Filled 2013-03-08 (×2): qty 1

## 2013-03-08 MED ORDER — ALBUMIN HUMAN 25 % IV SOLN
50.0000 g | Freq: Once | INTRAVENOUS | Status: AC
  Administered 2013-03-08: 50 g via INTRAVENOUS
  Filled 2013-03-08: qty 200

## 2013-03-08 MED ORDER — SPIRONOLACTONE 50 MG PO TABS
50.0000 mg | ORAL_TABLET | Freq: Every day | ORAL | Status: DC
  Administered 2013-03-08 – 2013-03-09 (×2): 50 mg via ORAL
  Filled 2013-03-08 (×2): qty 1

## 2013-03-08 NOTE — Progress Notes (Signed)
PATIENT DETAILS Name: Carl Browning Age: 57 y.o. Sex: male Date of Birth: [birth year] Admit Date: 03/05/2013 Admitting Physician Cristal Ford, MD PCP:Pcp Not In System  Subjective: No complaints.  Assessment/Plan: Principal Problem:   Chest tightness -resolved -troponins X 3 neg -Echo- no wall motion wall abnormalities, Doppler's neg for DVT. Without hypoxia or tachycardia-low suspicion for PE. Ambulated in room today-with no recurrence of pain -doubt this was cardiac-suspect further work up can be done in the outpatient setting -change ASA to 81 mg  Active Problems: Ascites -increased distention on 7/17 -2/2 to underlying cirrhosis -claims baseline weight approx 220 lbs-weight 7/17 is 249 lbs -lasix/aldactone resumed again today -will order for paracentesis today-with albumin infusion -2 gm sodium diet  Renal Failure -not sure whether this acute or chronic -renal function worsening (creatinine at 2.0 on 7/17) -monitor lytes -we still have not received records from PCP in [facility], Licensed conveyancer will again try to obtain today.    DM (diabetes mellitus), type 2 -A1C 5.6 -CBG's stable but with slight hypoglycemia-will decrease Lantus 22 units daily, c/w SSI    Cirrhosis -claims to have a hx of underlying ETOH cirrhosis  Lower Ext Edema -improved with diuresis -related to ascites and hypoalbuminemia  Diastolic CHF -compensated-suspect ascites and edema are from cirrhosis -continue with Coreg  -daily weights  -strict I&O's  HTN -c/w Coreg, Lasix and Aldactone  Chronic Pain Syndrome -hx of chronic back pain -claims to be on Methadone 10 mg TID for last 14 yrs-resume at BID dosing -obtain records from Dr Ree Kida in [facility]-he last saw him in March of this year  Suicidal Ideations -Psych consult - NP notes depression, but SI are unlikely.  Offered inpatient therapy - patient refused.  Recommended SSRI and outpatient therapy. -currently stable without any  suicidal ideations  Disposition: Remain inpatient  DVT Prophylaxis: Prophylactic Lovenox   Code Status: Full code   Family Communication None  Procedures:  None  CONSULTS:  None   MEDICATIONS: Scheduled Meds: . aspirin  325 mg Oral Daily  . carvedilol  25 mg Oral BID WC  . enoxaparin (LOVENOX) injection  40 mg Subcutaneous Q24H  . furosemide  40 mg Oral Daily  . insulin aspart  0-5 Units Subcutaneous QHS  . insulin aspart  0-9 Units Subcutaneous TID WC  . insulin glargine  28 Units Subcutaneous QHS  . methadone  10 mg Oral Q12H  . sertraline  25 mg Oral Daily  . spironolactone  50 mg Oral Daily   Continuous Infusions:  PRN Meds:.acetaminophen, acetaminophen, hydrALAZINE, ondansetron (ZOFRAN) IV, ondansetron  Antibiotics: Anti-infectives   None       PHYSICAL EXAM: Vital signs in last 24 hours: Filed Vitals:   03/08/13 0500 03/08/13 0636 03/08/13 0700 03/08/13 1100  BP:  168/90  177/92  Pulse:  59 65 55  Temp:  98.8 F (37.1 C)  98.5 F (36.9 C)  TempSrc:  Oral  Oral  Resp:  16  16  Height:      Weight: 115.078 kg (253 lb 11.2 oz)     SpO2:  98%  98%    Weight change: 1.588 kg (3 lb 8 oz) Filed Weights   03/07/13 0500 03/07/13 0617 03/08/13 0500  Weight: 113.49 kg (250 lb 3.2 oz) 113.172 kg (249 lb 8 oz) 115.078 kg (253 lb 11.2 oz)   Body mass index is 35.4 kg/(m^2).   Gen Exam: Awake and alert with clear speech.   Neck: Supple, No JVD.  Chest: B/L Clear.   CVS: S1 S2 Regular, no murmurs.  Abdomen: firm, distended,  BS +, non tender,  Distended with shifting dullness. Extremities: 2+ edema, lower extremities warm to touch. Neurologic: Non Focal.   Skin: No Rash.   Wounds: N/A.    Intake/Output from previous day:  Intake/Output Summary (Last 24 hours) at 03/08/13 1234 Last data filed at 03/08/13 0500  Gross per 24 hour  Intake    360 ml  Output    275 ml  Net     85 ml     LAB RESULTS: CBC  Recent Labs Lab 03/05/13 1426  03/06/13 0210  WBC 5.4 5.6  HGB 11.5* 10.9*  HCT 33.9* 33.0*  PLT 166 137*  MCV 69.3* 70.4*  MCH 23.5* 23.2*  MCHC 33.9 33.0  RDW 15.2 15.5    Chemistries   Recent Labs Lab 03/05/13 1426 03/06/13 0210 03/07/13 0500 03/08/13 0605  NA 139 141 140 139  K 3.8 3.5 3.8 4.1  CL 105 106 106 107  CO2 24 30 28 27   GLUCOSE 115* 88 115* 78  BUN 22 22 25* 28*  CREATININE 1.70* 1.70* 2.00* 1.89*  CALCIUM 8.6 8.3* 8.1* 8.3*    CBG:  Recent Labs Lab 03/07/13 1709 03/07/13 2116 03/08/13 0810 03/08/13 0844 03/08/13 1218  GLUCAP 115* 129* 67* 100* 113*     Coagulation profile  Recent Labs Lab 03/06/13 0210  INR 1.02    Cardiac Enzymes  Recent Labs Lab 03/05/13 2029 03/06/13 0210  TROPONINI <0.30 <0.30     Recent Labs  03/06/13 0210  HGBA1C 5.6    Recent Labs  03/06/13 0210  VITAMINB12 655  FOLATE 11.9  FERRITIN 67  TIBC 292  IRON 48  RETICCTPCT 1.5    MICROBIOLOGY: Recent Results (from the past 240 hour(s))  URINE CULTURE     Status: None   Collection Time    03/05/13  8:00 PM      Result Value Range Status   Specimen Description URINE, CLEAN CATCH   Final   Special Requests NONE   Final   Culture  Setup Time 03/06/2013 06:38   Final   Colony Count NO GROWTH   Final   Culture NO GROWTH   Final   Report Status 03/07/2013 FINAL   Final    RADIOLOGY STUDIES/RESULTS: Dg Chest 2 View  03/05/2013   *RADIOLOGY REPORT*  Clinical Data: Chest pain  CHEST - 2 VIEW  Comparison: None.  Findings: Cardiomediastinal silhouette is unremarkable.  No acute infiltrate or pleural effusion.  No pulmonary edema.  Bony thorax is unremarkable.  IMPRESSION: No active disease.   Original Report Authenticated By: Natasha Mead, M.D.   US Abdomen Complete  03/06/2013   *RADIOLOGY REPORT*  Clinical Data:  Cirrhosis and acute renal failure.  COMPLETE ABDOMINAL ULTRASOUND  Comparison:  None.  Findings:  Gallbladder:  Numerous mobile echogenic shadowing gallstones are  present.  Wall thickness is within normal limits at 3 mm.  There is no sonographic Murphy's sign.  Common bile duct:  The common bile duct is at the limits of normal in diameter at 6.3 mm.  Liver:  The liver is shrunken with multiple echogenic foci.  No discrete mass lesion is present.  The edges are nodular, compatible with end-stage cirrhosis.  IVC:  Appears normal.  Pancreas:  Although the pancreas is difficult to visualize in its entirety, no focal pancreatic abnormality is identified.  Spleen:  The spleen is mildly enlarged, measuring 1.6  cm in maximal length.  No focal lesions are present.  The right kidney is of normal size and echo texture.  It measures 12.5 cm maximally.  Multiple shadowing calcifications are present, the largest measuring 7 mm.  There is no evidence for obstruction.  Right Kidney:  Measures  Left Kidney:  The left kidney is of normal size and echotexture measuring 12.1 cm maximally.  Multiple calcifications are noted without evidence for obstruction.  The largest stone is 8 mm.  Abdominal aorta:  The distal aorta is not visualized.  Minimal aneurysmal dilation is evident with maximal measured diameter of 3.1 cm.  Moderate free fluid is present within the peritoneal cavity.  IMPRESSION:  1.  Cirrhotic changes of the liver with scattered increased foci of echogenicity, overall decreased size, and a nodular edge. 2.  Moderate abdominal ascites. 3.  Enlarged spleen without a discrete lesion. 4.  Bilateral nephrolithiasis without obstruction. 5.  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   Original Report Authenticated By: Marin Roberts, M.D.    Jeoffrey Massed,  [facility] Pager:[phone]  If 7PM-7AM, please contact night-coverage [email or web address] Password [facility] 03/08/2013, 12:34 PM   LOS: 3 days

## 2013-03-08 NOTE — Progress Notes (Signed)
Hypoglycemic Event  CBG: 67  Treatment: 15 GM carbohydrate snack  Symptoms: None  Follow-up CBG: Time:844 CBG Result:100  Possible Reasons for Event: Unknown  Comments/MD notified:patient breakfast tray was here, patient ate and CBG returned to normal    Harlon Flor, [place]  Remember to initiate Hypoglycemia Order Set & complete

## 2013-03-08 NOTE — Progress Notes (Signed)
CSW completed Psych assessment and can not copy from [facility] at this time. CSW will attempt to copy over to [facility] at a later time today.    CSW to follow.   Carl Browning, LCSWA [facility] Emergency Dept.  [phone]

## 2013-03-08 NOTE — Progress Notes (Signed)
Pt's cbg was 98. Called NP on call- told to hold the lantus

## 2013-03-09 LAB — BASIC METABOLIC PANEL
CO2: 25 mEq/L (ref 19–32)
Calcium: 8.4 mg/dL (ref 8.4–10.5)
Creatinine, Ser: 1.73 mg/dL — ABNORMAL HIGH (ref 0.50–1.35)
Glucose, Bld: 93 mg/dL (ref 70–99)

## 2013-03-09 LAB — GLUCOSE, CAPILLARY: Glucose-Capillary: 78 mg/dL (ref 70–99)

## 2013-03-09 MED ORDER — INSULIN GLARGINE 100 UNIT/ML ~~LOC~~ SOLN: 18.0000 [IU] | Freq: Every day | SUBCUTANEOUS | Status: AC

## 2013-03-09 MED ORDER — CARVEDILOL 12.5 MG PO TABS
12.5000 mg | ORAL_TABLET | Freq: Two times a day (BID) | ORAL | Status: DC
  Filled 2013-03-09 (×2): qty 1

## 2013-03-09 MED ORDER — METHADONE HCL 10 MG PO TABS: 10.0000 mg | ORAL_TABLET | Freq: Two times a day (BID) | ORAL | Status: AC

## 2013-03-09 MED ORDER — FUROSEMIDE 40 MG PO TABS
40.0000 mg | ORAL_TABLET | Freq: Two times a day (BID) | ORAL | Status: DC
  Filled 2013-03-09 (×2): qty 1

## 2013-03-09 MED ORDER — INSULIN GLARGINE 100 UNIT/ML ~~LOC~~ SOLN
20.0000 [IU] | Freq: Every day | SUBCUTANEOUS | Status: DC
  Filled 2013-03-09: qty 0.2

## 2013-03-09 MED ORDER — INSULIN GLARGINE 100 UNIT/ML ~~LOC~~ SOLN
18.0000 [IU] | Freq: Every day | SUBCUTANEOUS | Status: DC
  Filled 2013-03-09: qty 0.18

## 2013-03-09 MED ORDER — SPIRONOLACTONE 50 MG PO TABS: 50.0000 mg | ORAL_TABLET | Freq: Every day | ORAL | Status: AC

## 2013-03-09 MED ORDER — SERTRALINE HCL 25 MG PO TABS: 25.0000 mg | ORAL_TABLET | Freq: Every day | ORAL | Status: AC

## 2013-03-09 MED ORDER — FUROSEMIDE 40 MG PO TABS: 40.0000 mg | ORAL_TABLET | Freq: Two times a day (BID) | ORAL | Status: AC

## 2013-03-09 NOTE — Progress Notes (Signed)
NURSING PROGRESS NOTE  Carl Browning [id number] Discharge Data: 03/09/2013 2:47 PM Attending Provider: Maretta Bees, MD PCP:Pcp Not In System     Otho Najjar to be D/C'd Home per MD order.    All IV's discontinued with no bleeding noted.  All belongings returned to patient for patient to take home.   Medications returned to patient from pharmacy.   Last Vital Signs:  Blood pressure 138/70, pulse 61, temperature 98.9 F (37.2 C), temperature source Oral, resp. rate 20, height 5\' 11"  (1.803 m), weight 112.265 kg (247 lb 8 oz), SpO2 96.00%.  Discharge Medication List   Medication List    STOP taking these medications       insulin aspart 100 UNIT/ML injection  Commonly known as:  novoLOG     lisinopril 20 MG tablet  Commonly known as:  PRINIVIL,ZESTRIL      TAKE these medications       carvedilol 25 MG tablet  Commonly known as:  COREG  Take 25 mg by mouth 2 (two) times daily with a meal.     furosemide 40 MG tablet  Commonly known as:  LASIX  Take 1 tablet (40 mg total) by mouth 2 (two) times daily.     insulin glargine 100 UNIT/ML injection  Commonly known as:  LANTUS  Inject 0.18 mLs (18 Units total) into the skin at bedtime.     methadone 10 MG tablet  Commonly known as:  DOLOPHINE  Take 1 tablet (10 mg total) by mouth 2 (two) times daily.     sertraline 25 MG tablet  Commonly known as:  ZOLOFT  Take 1 tablet (25 mg total) by mouth daily.     spironolactone 50 MG tablet  Commonly known as:  ALDACTONE  Take 1 tablet (50 mg total) by mouth daily.        Madelin Rear, MSN, RN, [employer]

## 2013-03-09 NOTE — Discharge Summary (Signed)
PATIENT DETAILS Name: Carl Browning Age: 57 y.o. Sex: male Date of Birth: Jan 18, 1956 MRN: [id number]. Admit Date: 03/05/2013 Admitting Physician: Cristal Ford, MD PCP:Pcp Not In System  Recommendations for Outpatient Follow-up:  1. Cautiously increase diuretic therapy- still with peripheral edema, however better than on admission. 2. Monitor renal function and electrolytes 3. Outpatient referral to gastroenterology/hepatology clinic 4. Outpatient referral to cardiology if he has chest pain in the future.  PRIMARY DISCHARGE DIAGNOSIS:  Principal Problem:   Chest tightness Active Problems:   DM (diabetes mellitus), type 2, uncontrolled with complications   Cirrhosis   Chronic back pain   Abdominal distension   Bilateral edema of lower extremity   Uncontrolled hypertension   Anemia   Acute renal failure      PAST MEDICAL HISTORY: Past Medical History  Diagnosis Date  . CHF (congestive heart failure)   . Hypertension   . DM (diabetes mellitus), type 2, uncontrolled with complications   . Cirrhosis   . Anginal pain   . Hypercholesteremia   . Exertional dyspnea   . Hepatitis C     "took tx for it in 2001" (03/05/2013)  . Depression   . Kidney stones   . Chronic lower back pain     "that's why I'm on disability; hurt my back years ago" (03/05/2013)    DISCHARGE MEDICATIONS:   Medication List    STOP taking these medications       insulin aspart 100 UNIT/ML injection  Commonly known as:  novoLOG     lisinopril 20 MG tablet  Commonly known as:  PRINIVIL,ZESTRIL      TAKE these medications       carvedilol 25 MG tablet  Commonly known as:  COREG  Take 25 mg by mouth 2 (two) times daily with a meal.     furosemide 40 MG tablet  Commonly known as:  LASIX  Take 1 tablet (40 mg total) by mouth 2 (two) times daily.     insulin glargine 100 UNIT/ML injection  Commonly known as:  LANTUS  Inject 0.18 mLs (18 Units total) into the skin at bedtime.     methadone 10 MG  tablet  Commonly known as:  DOLOPHINE  Take 1 tablet (10 mg total) by mouth 2 (two) times daily.     sertraline 25 MG tablet  Commonly known as:  ZOLOFT  Take 1 tablet (25 mg total) by mouth daily.     spironolactone 50 MG tablet  Commonly known as:  ALDACTONE  Take 1 tablet (50 mg total) by mouth daily.        ALLERGIES:  No Known Allergies  BRIEF HPI:  See H&P, Labs, Consult and Test reports for all details in brief, patient is a 57 year old [place] male with a past medical history of chronic hepatitis C, alcohol use, cirrhosis, hypertension,  type 2 diabetes, suspected chronic kidney disease who presented to the hospital for chest discomfort, shortness of breath and worsening abdominal distention and lower extremity edema. He was admitted for further workup and treatment.  CONSULTATIONS:   None  PERTINENT RADIOLOGIC STUDIES: Dg Chest 2 View  03/05/2013   *RADIOLOGY REPORT*  Clinical Data: Chest pain  CHEST - 2 VIEW  Comparison: None.  Findings: Cardiomediastinal silhouette is unremarkable.  No acute infiltrate or pleural effusion.  No pulmonary edema.  Bony thorax is unremarkable.  IMPRESSION: No active disease.   Original Report Authenticated By: Natasha Mead, M.D.   US Abdomen Complete  03/06/2013   *  RADIOLOGY REPORT*  Clinical Data:  Cirrhosis and acute renal failure.  COMPLETE ABDOMINAL ULTRASOUND  Comparison:  None.  Findings:  Gallbladder:  Numerous mobile echogenic shadowing gallstones are present.  Wall thickness is within normal limits at 3 mm.  There is no sonographic Murphy's sign.  Common bile duct:  The common bile duct is at the limits of normal in diameter at 6.3 mm.  Liver:  The liver is shrunken with multiple echogenic foci.  No discrete mass lesion is present.  The edges are nodular, compatible with end-stage cirrhosis.  IVC:  Appears normal.  Pancreas:  Although the pancreas is difficult to visualize in its entirety, no focal pancreatic abnormality is  identified.  Spleen:  The spleen is mildly enlarged, measuring 1.6 cm in maximal length.  No focal lesions are present.  The right kidney is of normal size and echo texture.  It measures 12.5 cm maximally.  Multiple shadowing calcifications are present, the largest measuring 7 mm.  There is no evidence for obstruction.  Right Kidney:  Measures  Left Kidney:  The left kidney is of normal size and echotexture measuring 12.1 cm maximally.  Multiple calcifications are noted without evidence for obstruction.  The largest stone is 8 mm.  Abdominal aorta:  The distal aorta is not visualized.  Minimal aneurysmal dilation is evident with maximal measured diameter of 3.1 cm.  Moderate free fluid is present within the peritoneal cavity.  IMPRESSION:  1.  Cirrhotic changes of the liver with scattered increased foci of echogenicity, overall decreased size, and a nodular edge. 2.  Moderate abdominal ascites. 3.  Enlarged spleen without a discrete lesion. 4.  Bilateral nephrolithiasis without obstruction. 5.  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   Original Report Authenticated By: Marin Roberts, M.D.     PERTINENT LAB RESULTS: CBC: No results found for this basename: WBC, HGB, HCT, PLT,  in the last 72 hours CMET CMP     Component Value Date/Time   NA 138 03/09/2013 0528   K 3.9 03/09/2013 0528   CL 106 03/09/2013 0528   CO2 25 03/09/2013 0528   GLUCOSE 93 03/09/2013 0528   BUN 27* 03/09/2013 0528   CREATININE 1.73* 03/09/2013 0528   CALCIUM 8.4 03/09/2013 0528   PROT 6.0 03/06/2013 0210   ALBUMIN 1.9* 03/06/2013 0210   AST 61* 03/06/2013 0210   ALT 25 03/06/2013 0210   ALKPHOS 108 03/06/2013 0210   BILITOT 0.3 03/06/2013 0210   GFRNONAA 42* 03/09/2013 0528   GFRAA 49* 03/09/2013 0528    GFR Estimated Creatinine Clearance: 60.8 ml/min (by C-G formula based on Cr of 1.73). No results found for this basename: LIPASE, AMYLASE,  in the last 72 hours No results found  for this basename: CKTOTAL, CKMB, CKMBINDEX, TROPONINI,  in the last 72 hours No components found with this basename: POCBNP,  No results found for this basename: DDIMER,  in the last 72 hours No results found for this basename: HGBA1C,  in the last 72 hours No results found for this basename: CHOL, HDL, LDLCALC, TRIG, CHOLHDL, LDLDIRECT,  in the last 72 hours No results found for this basename: TSH, T4TOTAL, FREET3, T3FREE, THYROIDAB,  in the last 72 hours No results found for this basename: VITAMINB12, FOLATE, FERRITIN, TIBC, IRON, RETICCTPCT,  in the last 72 hours Coags: No results found for this basename: PT, INR,  in the last 72 hours Microbiology: Recent Results (from the past 240 hour(s))  URINE CULTURE     Status: None   Collection Time    03/05/13  8:00 PM      Result Value Range Status   Specimen Description URINE, CLEAN CATCH   Final   Special Requests NONE   Final   Culture  Setup Time 03/06/2013 06:38   Final   Colony Count NO GROWTH   Final   Culture NO GROWTH   Final   Report Status 03/07/2013 FINAL   Final     BRIEF HOSPITAL COURSE:   Principal Problem:   Chest tightness - Patient was admitted to a telemetry unit, cardiac enzymes were cycled, troponins were negative x3. He was given aspirin. - A 2-D echocardiogram showed no wall motion abnormalities. Dopplers of the leg were negative for DVT.Without hypoxia or tachycardia-low suspicion for PE. Patient has ambulated in the room and in the hallway during this admission, with no recurrence of pain  -doubt this was cardiac-suspect further work up can be done in the outpatient setting if need be. - Suspect, chest discomfort and shortness of breath but presumably secondary to fluid overload and worsening ascites.  Active Problems: Ascites - Secondary to underlying liver cirrhosis from presumed hepatitis C and alcohol use - Patient underwent a paracentesis and albumin infusion along with it on 7/18, this morning his belly  is significantly less distended. - With increase his Lasix to 40 mg twice a day, we have continued his Aldactone. He claims to me that, he stopped taking his diuretics as he ran out of it a few weeks back. Suspect that this may have been the etiology for worsening fluid overload.  Suspected acute on chronic kidney disease stage 3 - No prior labs to compare with  - Patient was started on IV Lasix on admission, creatinine on presentation was 1.7, however with  Increase diuretic therapy creatinine did go up to 2.0, diuretics were briefly held, once the creatinine started downtrending, patient underwent a paracentesis. He was restarted on his diuretics, creatinine on discharge is 1.73. As noted above, we will need to slowly and cautiously increase his diuretic therapy, he needs to be compliant with a low-salt diet as well. All of this was explained to the patient in great detail. We will follow him up at the [facility], and make further adjustments of his diuretic therapy depending on his renal function.    DM (diabetes mellitus), type 2, uncontrolled with complications - Patient's A1c is 5.6 - Sugars have been running in the 90s to 200s, Lantus dosing has been been decreased to 18 units. - Other optimization will be done in the outpatient setting.    Cirrhosis - Suspected from hepatitis C and alcohol use. - I did finally obtain echo records from his primary care practitioner's office on 7/18 , it does document a patient does in fact have hepatitis C. - He will need further optimization of his diuretic to her regimen and referral to gastroenterology in the outpatient setting for further continued care. - A. abdominal ultrasound showed a shrunken liver without any discrete mass. Liver edges were nodular compatible with end-stage cirrhosis. - Further baseline workup of hepatitis C including viral load Septra can be done in the outpatient setting on followup. This is a chronic issue for the  patient.  Diastolic CHF  -compensated-suspect ascites and edema are from cirrhosis  -continue with Coreg  - Diuretics as above    Chronic back pain - Continue with methadone.   HTN  -c/w Coreg,  Lasix and Aldactone -stable  Suicidal Ideations  -Psych consult - NP notes depression, but SI are unlikely. Psychiatry Offered inpatient therapy - patient refused. Recommended SSRI and outpatient therapy. Subsequently has been started on Zoloft. -currently stable without any suicidal ideations  TODAY-DAY OF DISCHARGE:  Subjective:   Carl Browning today has no headache,no chest abdominal pain,no new weakness tingling or numbness, feels much better wants to go home today.   Objective:   Blood pressure 138/70, pulse 61, temperature 98.9 F (37.2 C), temperature source Oral, resp. rate 20, height 5\' 11"  (1.803 m), weight 112.265 kg (247 lb 8 oz), SpO2 96.00%.  Intake/Output Summary (Last 24 hours) at 03/09/13 1135 Last data filed at 03/09/13 0944  Gross per 24 hour  Intake    660 ml  Output    800 ml  Net   -140 ml   Filed Weights   03/07/13 0617 03/08/13 0500 03/09/13 0450  Weight: 113.172 kg (249 lb 8 oz) 115.078 kg (253 lb 11.2 oz) 112.265 kg (247 lb 8 oz)    Exam Awake Alert, Oriented *3, No new F.N deficits, Normal affect [place].AT,PERRAL Supple Neck,No JVD, No cervical lymphadenopathy appriciated.  Symmetrical Chest wall movement, Good air movement bilaterally, CTAB RRR,No Gallops,Rubs or new Murmurs, No Parasternal Heave +ve B.Sounds, Abd Soft, Non tender, No organomegaly appriciated, No rebound -guarding or rigidity. Mildly distended-however significantly better than before. No Cyanosis, Clubbing , No new Rash or bruise. 1-2+ pitting edema.  DISCHARGE CONDITION: Stable  DISPOSITION: Home  DISCHARGE INSTRUCTIONS:    Activity:  As tolerated   Diet recommendation: Diabetic Diet Heart Healthy diet      Discharge Orders   Future Appointments Provider Department Dept  Phone   03/18/2013 3:00 PM [facility] Covering Provider [facility] Flores [phone]   Future Orders Complete By Expires     (HEART FAILURE PATIENTS) Call MD:  Anytime you have any of the following symptoms: 1) 3 pound weight gain in 24 hours or 5 pounds in 1 week 2) shortness of breath, with or without a dry hacking cough 3) swelling in the hands, feet or stomach 4) if you have to sleep on extra pillows at night in order to breathe.  As directed     Call MD for:  severe uncontrolled pain  As directed     Diet - low sodium heart healthy  As directed     Diet Carb Modified  As directed     Increase activity slowly  As directed        Follow-up Information   Follow up with [facility] On 03/18/2013. (3 pm, please bring insurance card, photo id and medications)    Contact information:   [address] [place] [address] [phone]        Total Time spent on discharge equals 45 minutes.  SignedJeoffrey Massed 03/09/2013 11:35 AM

## 2013-03-10 NOTE — Consult Note (Signed)
Patient evaluated and treatment plan formulated by me

## 2013-03-11 LAB — GLUCOSE, CAPILLARY: Glucose-Capillary: 107 mg/dL — ABNORMAL HIGH (ref 70–99)

## 2013-03-11 LAB — PATHOLOGIST SMEAR REVIEW

## 2013-03-18 ENCOUNTER — Ambulatory Visit: Payer: Medicare Other | Attending: Family Medicine | Admitting: Family Medicine

## 2013-03-18 VITALS — BP 148/79 | HR 61 | Temp 99.1°F | Ht 71.0 in | Wt 236.8 lb

## 2013-03-18 DIAGNOSIS — R0789 Other chest pain: Secondary | ICD-10-CM

## 2013-03-18 DIAGNOSIS — E118 Type 2 diabetes mellitus with unspecified complications: Secondary | ICD-10-CM

## 2013-03-18 DIAGNOSIS — D649 Anemia, unspecified: Secondary | ICD-10-CM

## 2013-03-18 DIAGNOSIS — R142 Eructation: Secondary | ICD-10-CM

## 2013-03-18 DIAGNOSIS — IMO0002 Reserved for concepts with insufficient information to code with codable children: Secondary | ICD-10-CM

## 2013-03-18 DIAGNOSIS — R140 Abdominal distension (gaseous): Secondary | ICD-10-CM

## 2013-03-18 DIAGNOSIS — N179 Acute kidney failure, unspecified: Secondary | ICD-10-CM

## 2013-03-18 DIAGNOSIS — G8929 Other chronic pain: Secondary | ICD-10-CM

## 2013-03-18 DIAGNOSIS — E1165 Type 2 diabetes mellitus with hyperglycemia: Secondary | ICD-10-CM

## 2013-03-18 DIAGNOSIS — R141 Gas pain: Secondary | ICD-10-CM

## 2013-03-18 DIAGNOSIS — M549 Dorsalgia, unspecified: Secondary | ICD-10-CM

## 2013-03-18 DIAGNOSIS — K7460 Unspecified cirrhosis of liver: Secondary | ICD-10-CM

## 2013-03-18 DIAGNOSIS — I10 Essential (primary) hypertension: Secondary | ICD-10-CM

## 2013-03-18 DIAGNOSIS — R609 Edema, unspecified: Secondary | ICD-10-CM

## 2013-03-18 DIAGNOSIS — R600 Localized edema: Secondary | ICD-10-CM

## 2013-03-18 LAB — CBC
Hemoglobin: 10.6 g/dL — ABNORMAL LOW (ref 13.0–17.0)
MCHC: 33.1 g/dL (ref 30.0–36.0)
Platelets: 198 10*3/uL (ref 150–400)
RDW: 16.7 % — ABNORMAL HIGH (ref 11.5–15.5)

## 2013-03-18 NOTE — Progress Notes (Signed)
Patient ID: Carl Browning, male   DOB: [birth year], 57 y.o.   MRN: [id number]  CC: hospital follow up   HPI: Pt presenting from recent hospitalization where he was admitted with chest tightness, uncontrolled hypertension, cirrhosis with ascites, having had paracentesis and albumin infusion, pt is reporting that he is taking his medicaitons.  He does have insulin requiring diabetes and reports that his Bs have been 120-170.  Pt says that he is not established back with his gastroenterologist.  He has seen one locally but cannot remember the name.  He denies cramps.  He denies CP and SOB.  No fever or chills.  Pt is on methadone for chronic pain.   No Known Allergies Past Medical History  Diagnosis Date  . CHF (congestive heart failure)   . Hypertension   . DM (diabetes mellitus), type 2, uncontrolled with complications   . Cirrhosis   . Anginal pain   . Hypercholesteremia   . Exertional dyspnea   . Hepatitis C     "took tx for it in 2001" (03/05/2013)  . Depression   . Kidney stones   . Chronic lower back pain     "that's why I'm on disability; hurt my back years ago" (03/05/2013)   Current Outpatient Prescriptions on File Prior to Visit  Medication Sig Dispense Refill  . carvedilol (COREG) 25 MG tablet Take 25 mg by mouth 2 (two) times daily with a meal.      . furosemide (LASIX) 40 MG tablet Take 1 tablet (40 mg total) by mouth 2 (two) times daily.  60 tablet  0  . insulin glargine (LANTUS) 100 UNIT/ML injection Inject 0.18 mLs (18 Units total) into the skin at bedtime.  10 mL  1  . methadone (DOLOPHINE) 10 MG tablet Take 1 tablet (10 mg total) by mouth 2 (two) times daily.  20 tablet  0  . sertraline (ZOLOFT) 25 MG tablet Take 1 tablet (25 mg total) by mouth daily.  30 tablet  0  . spironolactone (ALDACTONE) 50 MG tablet Take 1 tablet (50 mg total) by mouth daily.  30 tablet  0   No current facility-administered medications on file prior to visit.   Family History  Problem Relation Age  of Onset  . Heart attack Mother   . Prostate cancer Father    History   Social History  . Marital Status: Single    Spouse Name: N/A    Number of Children: N/A  . Years of Education: N/A   Occupational History  . Not on file.   Social History Main Topics  . Smoking status: Current Every Day Smoker -- 0.12 packs/day for 36 years    Types: Cigarettes  . Smokeless tobacco: Never Used  . Alcohol Use: No     Comment: 03/05/2013 "Used to drink alcohol heavily, now rarely; don't buy it; might have a beer or glass of wine twice/yr"  . Drug Use: No  . Sexually Active: Not Currently   Other Topics Concern  . Not on file   Social History Narrative  . No narrative on file    Review of Systems  Constitutional: Negative for fever, chills, diaphoresis, activity change, appetite change and fatigue.  HENT: Negative for ear pain, nosebleeds, congestion, facial swelling, rhinorrhea, neck pain, neck stiffness and ear discharge.   Eyes: Negative for pain, discharge, redness, itching and visual disturbance.  Respiratory: Negative for cough, choking, chest tightness, shortness of breath, wheezing and stridor.   Cardiovascular: Negative for chest  pain, palpitations and leg swelling.  Gastrointestinal: Negative for abdominal distention.  Genitourinary: Negative for dysuria, urgency, frequency, hematuria, flank pain, decreased urine volume, difficulty urinating and dyspareunia.  Musculoskeletal: Negative for back pain, joint swelling, arthralgias and gait problem.  Neurological: Negative for dizziness, tremors, seizures, syncope, facial asymmetry, speech difficulty, weakness, light-headedness, numbness and headaches.  Hematological: Negative for adenopathy. Does not bruise/bleed easily.  Psychiatric/Behavioral: Negative for hallucinations, behavioral problems, confusion, dysphoric mood, decreased concentration and agitation.    Objective:   Filed Vitals:   03/18/13 1506  BP: 148/79  Pulse: 61   Temp: 99.1 F (37.3 C)    Physical Exam  Constitutional: Appears well-developed and well-nourished. No distress.  HENT: Normocephalic. External right and left ear normal. Oropharynx is clear and moist.  Eyes: Conjunctivae and EOM are normal. PERRLA, no scleral icterus.  Neck: Normal ROM. Neck supple. No JVD. No tracheal deviation. No thyromegaly.  CVS: RRR, S1/S2 +, no murmurs, no gallops, no carotid bruit.  Pulmonary: Effort and breath sounds normal, no stridor, rhonchi, wheezes, rales.  Abdominal: Ascites. BS +,  Mild distension, No tenderness, rebound or guarding.  Musculoskeletal: Normal range of motion. 2+ edema pitting bilateral LEs.   Lymphadenopathy: No lymphadenopathy noted, cervical, inguinal. Neuro: Alert. Normal reflexes, muscle tone coordination. No cranial nerve deficit. Skin: Skin is warm and dry. No rash noted. Not diaphoretic. No erythema. No pallor.  Psychiatric: Normal mood and affect. Behavior, judgment, thought content normal.   Lab Results  Component Value Date   WBC 5.6 03/06/2013   HGB 10.9* 03/06/2013   HCT 33.0* 03/06/2013   MCV 70.4* 03/06/2013   PLT 137* 03/06/2013   Lab Results  Component Value Date   CREATININE 1.73* 03/09/2013   BUN 27* 03/09/2013   NA 138 03/09/2013   K 3.9 03/09/2013   CL 106 03/09/2013   CO2 25 03/09/2013    Lab Results  Component Value Date   HGBA1C 5.6 03/06/2013   Lipid Panel  No results found for this basename: chol, trig, hdl, cholhdl, vldl, ldlcalc       Assessment and plan:   Patient Active Problem List   Diagnosis Date Noted  . Chest tightness 03/05/2013  . Abdominal distension 03/05/2013  . Bilateral edema of lower extremity 03/05/2013  . Uncontrolled hypertension 03/05/2013  . Anemia 03/05/2013  . Acute renal failure 03/05/2013  . DM (diabetes mellitus), type 2, uncontrolled with complications   . Cirrhosis   . Chronic back pain    Reviewed records from hospital  Check labs today.    Referral to  gastroenterology for cirrhosis evaluation  Pt is seeing Dr Ree Kida for his methadone treatment in [facility] per pt report  Requested old records.  Encouraged pt to monitor his BS closely  Hypoglycemia precautions discussed  Follow lab   The patient was counseled on the dangers of tobacco use, and was advised to quit.  Reviewed strategies to maximize success, including removing cigarettes and smoking materials from environment.   RTC in 1 month  The patient was given clear instructions to go to ER or return to medical center if symptoms don't improve, worsen or new problems develop.  The patient verbalized understanding.  The patient was told to call to get any lab results if not heard anything in the next week.    Rodney Langton, MD, CDE, FAAFP [facility] [place], [place]

## 2013-03-18 NOTE — Progress Notes (Signed)
Patient was seen in hospital for Chest pain& SOB. Patient states that he has been doing somewhat better.

## 2013-03-18 NOTE — Patient Instructions (Addendum)
Hypertension As your heart beats, it forces blood through your arteries. This force is your blood pressure. If the pressure is too high, it is called hypertension (HTN) or high blood pressure. HTN is dangerous because you may have it and not know it. High blood pressure may mean that your heart has to work harder to pump blood. Your arteries may be narrow or stiff. The extra work puts you at risk for heart disease, stroke, and other problems.  Blood pressure consists of two numbers, a higher number over a lower, 110/72, for example. It is stated as "110 over 72." The ideal is below 120 for the top number (systolic) and under 80 for the bottom (diastolic). Write down your blood pressure today. You should pay close attention to your blood pressure if you have certain conditions such as:  Heart failure.  Prior heart attack.  Diabetes  Chronic kidney disease.  Prior stroke.  Multiple risk factors for heart disease. To see if you have HTN, your blood pressure should be measured while you are seated with your arm held at the level of the heart. It should be measured at least twice. A one-time elevated blood pressure reading (especially in the Emergency Department) does not mean that you need treatment. There may be conditions in which the blood pressure is different between your right and left arms. It is important to see your caregiver soon for a recheck. Most people have essential hypertension which means that there is not a specific cause. This type of high blood pressure may be lowered by changing lifestyle factors such as:  Stress.  Smoking.  Lack of exercise.  Excessive weight.  Drug/tobacco/alcohol use.  Eating less salt. Most people do not have symptoms from high blood pressure until it has caused damage to the body. Effective treatment can often prevent, delay or reduce that damage. TREATMENT  When a cause has been identified, treatment for high blood pressure is directed at the  cause. There are a large number of medications to treat HTN. These fall into several categories, and your caregiver will help you select the medicines that are best for you. Medications may have side effects. You should review side effects with your caregiver. If your blood pressure stays high after you have made lifestyle changes or started on medicines,   Your medication(s) may need to be changed.  Other problems may need to be addressed.  Be certain you understand your prescriptions, and know how and when to take your medicine.  Be sure to follow up with your caregiver within the time frame advised (usually within two weeks) to have your blood pressure rechecked and to review your medications.  If you are taking more than one medicine to lower your blood pressure, make sure you know how and at what times they should be taken. Taking two medicines at the same time can result in blood pressure that is too low. SEEK IMMEDIATE MEDICAL CARE IF:  You develop a severe headache, blurred or changing vision, or confusion.  You have unusual weakness or numbness, or a faint feeling.  You have severe chest or abdominal pain, vomiting, or breathing problems. MAKE SURE YOU:   Understand these instructions.  Will watch your condition.  Will get help right away if you are not doing well or get worse. Document Released: 08/08/2005 Document Revised: 10/31/2011 Document Reviewed: 03/28/2008 [facility] Patient Information 2014 [place], [place]. Cirrhosis Cirrhosis is a condition of scarring of the liver which is caused when the liver  has tried repairing itself following damage. This damage may come from a previous infection such as one of the forms of hepatitis (usually hepatitis C), or the damage may come from being injured by toxins. The main toxin that causes this damage is alcohol. The scarring of the liver from use of alcohol is irreversible. That means the liver cannot return to normal even though  alcohol is not used any more. The main danger of hepatitis C infection is that it may cause long-lasting (chronic) liver disease, and this also may lead to cirrhosis. This complication is progressive and irreversible. CAUSES  Prior to available blood tests, hepatitis C could be contracted by blood transfusions. Since testing of blood has improved, this is now unlikely. This infection can also be contracted through intravenous drug use and the sharing of needles. It can also be contracted through sexual relationships. The injury caused by alcohol comes from too much use. It is not a few drinks that poison the liver, but years of misuse. Usually there will be some signs and symptoms early with scarring of the liver that suggest the development of better habits. Alcohol should never be used while using acetaminophen. A small dose of both taken together may cause irreversible damage to the liver. HOME CARE INSTRUCTIONS  There is no specific treatment for cirrhosis. However, there are things you can do to avoid making the condition worse.  Rest as needed.  Eat a well-balanced diet. Your caregiver can help you with suggestions.  Vitamin supplements including vitamins A, K, D, and thiamine can help.  A low-salt diet, water restriction, or diuretic medicine may be needed to reduce fluid retention.  Avoid alcohol. This can be extremely toxic if combined with acetaminophen.  Avoid drugs which are toxic to the liver. Some of these include isoniazid, methyldopa, acetaminophen, anabolic steroids (muscle-building drugs), erythromycin, and oral contraceptives (birth control pills). Check with your caregiver to make sure medicines you are presently taking will not be harmful.  Periodic blood tests may be required. Follow your caregiver's advice regarding the timing of these.  Milk thistle is an herbal remedy which does protect the liver against toxins. However, it will not help once the liver has been  scarred. SEEK MEDICAL CARE IF:  You have increasing fatigue or weakness.  You develop swelling of the hands, feet, legs, or face.  You vomit bright red blood, or a coffee ground appearing material.  You have blood in your stools, or the stools turn black and tarry.  You have a fever.  You develop loss of appetite, or have nausea and vomiting.  You develop jaundice.  You develop easy bruising or bleeding.  You have worsening of any of the problems you are concerned about. Document Released: 08/08/2005 Document Revised: 10/31/2011 Document Reviewed: 03/26/2008 [facility] Patient Information 2014 [place], [place]. Chronic Kidney Disease Chronic kidney disease occurs when the kidneys are damaged over a long period. The kidneys are two organs that lie on either side of the spine between the middle of the back and the front of the abdomen. The kidneys:   Remove wastes and extra water from the blood.   Produce important hormones. These help keep bones strong, regulate blood pressure, and help create red blood cells.   Balance the fluids and chemicals in the blood and tissues. A small amount of kidney damage may not cause problems, but a large amount of damage may make it difficult or impossible for the kidneys to work the way they should. If  steps are not taken to slow down the kidney damage or stop it from getting worse, the kidneys may stop working permanently. Most of the time, chronic kidney disease does not go away. However, it can often be controlled, and those with the disease can usually live normal lives. CAUSES  The most common causes of chronic kidney disease are diabetes and high blood pressure (hypertension). Chronic kidney disease may also be caused by:   Diseases that cause kidneys' filters to become inflamed.   Diseases that affect the immune system.   Genetic diseases.   Medicines that damage the kidneys, such as anti-inflammatory medicines.  Poisoning or  exposure to toxic substances.   A reoccurring kidney or urinary infection.   A problem with urine flow. This may be caused by:   Cancer.   Kidney stones.   An enlarged prostate in males. SYMPTOMS  Because the kidney damage in chronic kidney disease occurs slowly, symptoms develop slowly and may not be obvious until the kidney damage becomes severe. A person may have a kidney disease for years without showing any symptoms. Symptoms can include:   Swelling (edema) of the legs, ankles, or feet.   Tiredness (lethargy).   Nausea or vomiting.   Confusion.   Problems with urination, such as:   Decreased urine production.   Frequent urination, especially at night.   Frequent accidents in children who are potty trained.   Muscle twitches and cramps.   Shortness of breath.  Weakness.   Persistent itchiness.   Loss of appetite.  Metallic taste in the mouth.  Trouble sleeping.  Slowed development in children.  Short stature in children. DIAGNOSIS  Chronic kidney disease may be detected and diagnosed by tests, including blood, urine, imaging, or kidney biopsy tests.  TREATMENT  Most chronic kidney diseases cannot be cured. Treatment usually involves relieving symptoms and preventing or slowing the progression of the disease. Treatment may include:   A special diet. You may need to avoid alcohol and foods thatare salty and high in potassium.   Medicines. These may:   Lower blood pressure.   Relieve anemia.   Relieve swelling.   Protect the bones. HOME CARE INSTRUCTIONS   Follow your prescribed diet.   Only take over-the-counter or prescription medicines as directed by your caregiver.  Do not take any new medicines (prescription, over-the-counter, or nutritional supplements) unless approved by your caregiver. Many medicines can worsen your kidney damage or need to have the dose adjusted.   Quit smoking if you are a smoker. Talk to your  caregiver about a smoking cessation program.   Keep all follow-up appointments as directed by your caregiver. SEEK IMMEDIATE MEDICAL CARE IF:  Your symptoms get worse or you develop new symptoms.   You develop symptoms of end-stage kidney disease. These include:   Headaches.   Abnormally dark or light skin.   Numbness in the hands or feet.   Easy bruising.   Frequent hiccups.   Menstruation stops.   You have a fever.   You have decreased urine production.   You havepain or bleeding when urinating. MAKE SURE YOU:  Understand these instructions.  Will watch your condition.  Will get help right away if you are not doing well or get worse. FOR MORE INFORMATION  American Association of Kidney Patients: ResidentialShow.is National Kidney Foundation: www.kidney.org American Kidney Fund: FightingMatch.com.ee [facility]: www.lifeoptions.org and www.kidneyschool.org Document Released: 05/17/2008 Document Revised: 07/25/2012 Document Reviewed: 04/06/2012 [facility] Patient Information 2014 [place], [place].

## 2013-03-19 LAB — COMPLETE METABOLIC PANEL WITH GFR
ALT: 26 U/L (ref 0–53)
AST: 47 U/L — ABNORMAL HIGH (ref 0–37)
Albumin: 2.5 g/dL — ABNORMAL LOW (ref 3.5–5.2)
Alkaline Phosphatase: 128 U/L — ABNORMAL HIGH (ref 39–117)
Potassium: 4.4 mEq/L (ref 3.5–5.3)
Sodium: 138 mEq/L (ref 135–145)
Total Protein: 6.3 g/dL (ref 6.0–8.3)

## 2013-03-19 NOTE — Progress Notes (Signed)
Quick Note:  Please inform patient that his labs came back stable. No significant changes since being discharged from the hospital.   Rodney Langton, MD, CDE, FAAFP [facility] [place], [place]   ______

## 2013-03-20 ENCOUNTER — Telehealth: Payer: Self-pay | Admitting: *Deleted

## 2013-03-20 NOTE — Telephone Encounter (Signed)
03/20/13 Patient made aware that his labs came back stable message left via telephone. P.Swayzee Wadley,RN BSN [facility]

## 2013-04-18 ENCOUNTER — Ambulatory Visit: Payer: Self-pay

## 2013-05-15 ENCOUNTER — Encounter: Payer: Self-pay | Admitting: Internal Medicine

## 2013-06-27 ENCOUNTER — Other Ambulatory Visit: Payer: Self-pay

## 2013-09-19 ENCOUNTER — Other Ambulatory Visit: Payer: Self-pay | Admitting: *Deleted

## 2013-09-19 DIAGNOSIS — L97909 Non-pressure chronic ulcer of unspecified part of unspecified lower leg with unspecified severity: Secondary | ICD-10-CM

## 2013-09-19 DIAGNOSIS — I70219 Atherosclerosis of native arteries of extremities with intermittent claudication, unspecified extremity: Secondary | ICD-10-CM

## 2013-09-26 ENCOUNTER — Encounter (HOSPITAL_BASED_OUTPATIENT_CLINIC_OR_DEPARTMENT_OTHER): Payer: Medicare Other | Attending: Internal Medicine

## 2013-09-26 DIAGNOSIS — L97909 Non-pressure chronic ulcer of unspecified part of unspecified lower leg with unspecified severity: Secondary | ICD-10-CM

## 2013-09-26 DIAGNOSIS — I87339 Chronic venous hypertension (idiopathic) with ulcer and inflammation of unspecified lower extremity: Secondary | ICD-10-CM | POA: Insufficient documentation

## 2013-09-26 DIAGNOSIS — E1169 Type 2 diabetes mellitus with other specified complication: Secondary | ICD-10-CM | POA: Insufficient documentation

## 2013-09-26 DIAGNOSIS — I119 Hypertensive heart disease without heart failure: Secondary | ICD-10-CM | POA: Insufficient documentation

## 2013-09-26 DIAGNOSIS — N200 Calculus of kidney: Secondary | ICD-10-CM | POA: Insufficient documentation

## 2013-09-26 DIAGNOSIS — K7460 Unspecified cirrhosis of liver: Secondary | ICD-10-CM | POA: Insufficient documentation

## 2013-09-26 DIAGNOSIS — E785 Hyperlipidemia, unspecified: Secondary | ICD-10-CM | POA: Insufficient documentation

## 2013-09-26 DIAGNOSIS — B182 Chronic viral hepatitis C: Secondary | ICD-10-CM | POA: Insufficient documentation

## 2013-09-26 DIAGNOSIS — Z794 Long term (current) use of insulin: Secondary | ICD-10-CM | POA: Insufficient documentation

## 2013-09-26 DIAGNOSIS — I872 Venous insufficiency (chronic) (peripheral): Secondary | ICD-10-CM | POA: Insufficient documentation

## 2013-09-26 DIAGNOSIS — Z79899 Other long term (current) drug therapy: Secondary | ICD-10-CM | POA: Insufficient documentation

## 2013-09-27 NOTE — Progress Notes (Signed)
[facility]  NAMErnestene Kiel:  Browning, Carl                   ACCOUNT NO.:  [id number]  MEDICAL RECORD NO.:  [id number]      DATE OF BIRTH:  1956/05/12  PHYSICIAN:  Maxwell CaulMichael G. Robson, M.D. VISIT DATE:  09/26/2013                                  OFFICE VISIT   HISTORY:  Mr. Carl Browning is a diabetic who states that roughly a month ago, he developed a wound on his right lateral leg in association with increasing edema, probably related to his underlying hepatic cirrhosis. In any case, he has been left with a small wound on the right anterior leg.  It looks possible that he had one on the left leg, although this is largely closed over.  He does not have a significant wound history.  PAST MEDICAL HISTORY:  Includes diabetic type 2; chronic hepatitis C, underlying cirrhosis; chronic pain syndrome; hyperlipidemia; hypertensive heart disease; osteoarthritis and anemia. He also has nephrolithiasis.  MEDICATIONS:  Reviewed.  He is on carvedilol 25 b.i.d.; Lasix 40 b.i.d.; Lantus insulin exact dose is not certain; methadone 10 mg b.i.d.; sertraline 25 daily; spironolactone 50 mg daily.  On examination, temperature is 99.6, pulse 64, blood pressure 224/80.  VASCULAR ASSESSMENT:  We are unable to obtain his ABIs in the right leg. He apparently already has had arterial Dopplers and ABIs ordered.  Wound exam over the right leg measures 2 x 1.3 x 0.1.  This underwent a nonsurgical debridement with a #15 blade.  He tolerated this well.  The wound bed itself is covered with a very "sandpaper-like eschar".  This will need further debridement both with Santyl and mechanically.  He has venous stasis physiology.  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  The wound underwent debridement above.  He will be treated with Santyl, Kerlix, and Coban which will be left in place for the week.  We will  see him back in a week's time.          ______________________________ Maxwell CaulMichael G. Robson, M.D.     MGR/MEDQ  D:  09/26/2013  T:  09/27/2013  Job:  [id number]

## 2013-10-01 ENCOUNTER — Encounter: Payer: Self-pay | Admitting: Vascular Surgery

## 2013-10-02 ENCOUNTER — Encounter: Payer: Self-pay | Admitting: *Deleted

## 2013-10-02 ENCOUNTER — Ambulatory Visit (HOSPITAL_COMMUNITY)
Admission: RE | Admit: 2013-10-02 | Discharge: 2013-10-02 | Disposition: A | Discharge status: Home / Self Care | Payer: Medicare Other | Source: Ambulatory Visit | Attending: Vascular Surgery | Admitting: Vascular Surgery

## 2013-10-02 ENCOUNTER — Other Ambulatory Visit: Payer: Self-pay | Admitting: *Deleted

## 2013-10-02 ENCOUNTER — Encounter: Payer: Self-pay | Admitting: Vascular Surgery

## 2013-10-02 ENCOUNTER — Ambulatory Visit (INDEPENDENT_AMBULATORY_CARE_PROVIDER_SITE_OTHER): Payer: Medicare Other | Admitting: Vascular Surgery

## 2013-10-02 VITALS — BP 188/73 | HR 102 | Resp 16 | Ht 71.0 in | Wt 239.0 lb

## 2013-10-02 DIAGNOSIS — L97909 Non-pressure chronic ulcer of unspecified part of unspecified lower leg with unspecified severity: Secondary | ICD-10-CM | POA: Insufficient documentation

## 2013-10-02 DIAGNOSIS — I70219 Atherosclerosis of native arteries of extremities with intermittent claudication, unspecified extremity: Secondary | ICD-10-CM | POA: Insufficient documentation

## 2013-10-02 DIAGNOSIS — L98499 Non-pressure chronic ulcer of skin of other sites with unspecified severity: Secondary | ICD-10-CM

## 2013-10-02 DIAGNOSIS — I70209 Unspecified atherosclerosis of native arteries of extremities, unspecified extremity: Secondary | ICD-10-CM

## 2013-10-02 DIAGNOSIS — I739 Peripheral vascular disease, unspecified: Secondary | ICD-10-CM

## 2013-10-02 DIAGNOSIS — F17200 Nicotine dependence, unspecified, uncomplicated: Secondary | ICD-10-CM | POA: Insufficient documentation

## 2013-10-02 DIAGNOSIS — I10 Essential (primary) hypertension: Secondary | ICD-10-CM | POA: Insufficient documentation

## 2013-10-02 DIAGNOSIS — L97209 Non-pressure chronic ulcer of unspecified calf with unspecified severity: Secondary | ICD-10-CM | POA: Insufficient documentation

## 2013-10-02 DIAGNOSIS — E119 Type 2 diabetes mellitus without complications: Secondary | ICD-10-CM | POA: Insufficient documentation

## 2013-10-02 NOTE — Assessment & Plan Note (Signed)
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 I have recommended we proceed with arteriography to evaluate him for revascularization. I concerned that without revascularization this could potentially become a limb threatening problem given his diabetes and infrainguinal arterial occlusive disease. I have reviewed with the patient the indications for arteriography. In addition, I have reviewed the potential complications of arteriography including but not limited to: Bleeding, arterial injury, arterial thrombosis, dye action, renal insufficiency, or other unpredictable medical problems. I have explained to the patient that if we find disease amenable to angioplasty we could potentially address this at the same time. I have discussed the potential complications of angioplasty and stenting, including but not limited to: Bleeding, arterial thrombosis, arterial injury, dissection, or the need for surgical intervention.

## 2013-10-02 NOTE — Progress Notes (Signed)
[facility]  Patient name: Carl Browning MRN: [id number] DOB: February 10, 1956 Sex: male  REASON FOR CONSULT: pretibial wound right leg  HPI: Carl Browning is a 59 y.o. male who apparently developed a blister on his right leg approximately 3 months ago. This developed into a pretibial wound which has been slow to heal. He was sent for vascular consultation. He does describe bilateral lower extremity claudication. His symptoms are actually worse on the left side. He experiences calf pain which is brought on by ambulation and relieved with rest. He also describes some pain in both feet although this may be neuropathy from his diabetes. The wound of the right leg has shown little progress.  His risk factors for peripheral vascular disease include diabetes, hypertension, hypercholesterolemia, premature cardiovascular disease, and tobacco use.   Past Medical History  Diagnosis Date  . CHF (congestive heart failure)   . Hypertension   . DM (diabetes mellitus), type 2, uncontrolled with complications   . Cirrhosis   . Anginal pain   . Hypercholesteremia   . Exertional dyspnea   . Hepatitis C     "took tx for it in 2001" (03/05/2013)  . Depression   . Kidney stones   . Chronic lower back pain     "that's why I'm on disability; hurt my back years ago" (03/05/2013)  . Ulcer     right lower leg   Family History  Problem Relation Age of Onset  . Heart attack Mother   . Prostate cancer Father    SOCIAL HISTORY: History  Substance Use Topics  . Smoking status: Current Every Day Smoker -- 0.12 packs/day for 36 years    Types: Cigarettes  . Smokeless tobacco: Never Used  . Alcohol Use: No     Comment: 03/05/2013 "Used to drink alcohol heavily, now rarely; don't buy it; might have a beer or glass of wine twice/yr"   No Known Allergies Current Outpatient Prescriptions  Medication Sig Dispense Refill  . carvedilol (COREG) 25 MG tablet Take 25 mg by mouth 2 (two) times daily  with a meal.      . clopidogrel (PLAVIX) 75 MG tablet Take 75 mg by mouth daily with breakfast.      . furosemide (LASIX) 40 MG tablet Take 1 tablet (40 mg total) by mouth 2 (two) times daily.  60 tablet  0  . insulin aspart (NOVOLOG FLEXPEN) 100 UNIT/ML FlexPen Inject 100 Units into the skin 3 (three) times daily with meals.      . insulin detemir (LEVEMIR) 100 UNIT/ML injection Inject 100 Units into the skin at bedtime.      . insulin glargine (LANTUS) 100 UNIT/ML injection Inject 0.18 mLs (18 Units total) into the skin at bedtime.  10 mL  1  . lisinopril (PRINIVIL,ZESTRIL) 20 MG tablet Take 20 mg by mouth daily.      . methadone (DOLOPHINE) 10 MG tablet Take 1 tablet (10 mg total) by mouth 2 (two) times daily.  20 tablet  0  . omeprazole (PRILOSEC) 20 MG capsule Take 20 mg by mouth daily.      . QUEtiapine (SEROQUEL) 50 MG tablet Take 50 mg by mouth at bedtime.      . sertraline (ZOLOFT) 25 MG tablet Take 1 tablet (25 mg total) by mouth daily.  30 tablet  0  . spironolactone (ALDACTONE) 50 MG tablet Take 1 tablet (50 mg total) by mouth daily.  30 tablet  0   No current  facility-administered medications for this visit.   REVIEW OF SYSTEMS: Arly.Keller ] denotes positive finding; [  ] denotes negative finding  CARDIOVASCULAR:  [ ]  chest pain   [ ]  chest pressure   [ ]  palpitations   [ ]  orthopnea   Arly.Keller ] dyspnea on exertion   Arly.Keller ] claudication   Arly.Keller ] rest pain   [ ]  DVT   [ ]  phlebitis PULMONARY:   [ ]  productive cough   [ ]  asthma   [ ]  wheezing NEUROLOGIC:   [ ]  weakness  [ ]  paresthesias  [ ]  aphasia  [ ]  amaurosis  [ ]  dizziness HEMATOLOGIC:   [ ]  bleeding problems   [ ]  clotting disorders MUSCULOSKELETAL:  [ ]  joint pain   [ ]  joint swelling Arly.Keller ] leg swelling GASTROINTESTINAL: [ ]   blood in stool  [ ]   hematemesis GENITOURINARY:  [ ]   dysuria  [ ]   hematuria PSYCHIATRIC:  Arly.Keller ] history of major depression INTEGUMENTARY:  [ ]  rashes  Arly.Keller ] ulcers CONSTITUTIONAL:  [ ]  fever   [ ]  chills  PHYSICAL  EXAM: Filed Vitals:   10/02/13 1406  BP: 188/73  Pulse: 102  Resp: 16  Height: 5\' 11"  (1.803 m)  Weight: 239 lb (108.41 kg)   Body mass index is 33.35 kg/(m^2). GENERAL: The patient is a well-nourished male, in no acute distress. The vital signs are documented above. CARDIOVASCULAR: There is a regular rate and rhythm. I do not detect carotid bruits. On the right side, which is asymptomatic side, he has a palpable femoral and popliteal pulse. I cannot palpate a dorsalis pedis or posterior tibial pulse. On the left side, he has a palpable femoral pulse. I cannot palpate a popliteal, dorsalis pedis, or posterior tibial pulse. He has mild bilateral lower extremity swelling. PULMONARY: There is good air exchange bilaterally without wheezing or rales. ABDOMEN: Soft and non-tender with normal pitched bowel sounds.  MUSCULOSKELETAL: There are no major deformities or cyanosis. NEUROLOGIC: No focal weakness or paresthesias are detected. SKIN: The pretibial wound on his right leg measures 1 cm in width by 3 cm in height. PSYCHIATRIC: The patient has a normal affect.  DATA:  I have independently interpreted his arterial Doppler study. He has a monophasic right posterior tibial and dorsalis pedis signal with an ABI of 77%. This may be falsely elevated. Toe pressure on the right is 26 mm of mercury. On the left side he has a monophasic peroneal and dorsalis pedis signal. There is no posterior tibial signal. His ABI on the left is 70%. This may be falsely elevated. Toe pressure on the left is 26 mm of mercury.  MEDICAL ISSUES:  Atherosclerotic PVD with ulceration This patient has a nonhealing wound on his right leg with evidence of tibial artery occlusive disease. I have recommended we proceed with arteriography to evaluate him for revascularization. I concerned that without revascularization this could potentially become a limb threatening problem given his diabetes and infrainguinal arterial occlusive  disease. I have reviewed with the patient the indications for arteriography. In addition, I have reviewed the potential complications of arteriography including but not limited to: Bleeding, arterial injury, arterial thrombosis, dye action, renal insufficiency, or other unpredictable medical problems. I have explained to the patient that if we find disease amenable to angioplasty we could potentially address this at the same time. I have discussed the potential complications of angioplasty and stenting, including but not limited to: Bleeding, arterial thrombosis, arterial injury,  dissection, or the need for surgical intervention.    Hargun Spurling S [facility] [place] [facility]: [phone]

## 2013-10-03 ENCOUNTER — Encounter (HOSPITAL_COMMUNITY): Payer: Self-pay | Admitting: Pharmacy Technician

## 2013-10-07 ENCOUNTER — Encounter (HOSPITAL_COMMUNITY)
Admission: RE | Disposition: A | Discharge status: Home / Self Care | Payer: Medicare Other | Source: Ambulatory Visit | Attending: Vascular Surgery

## 2013-10-07 ENCOUNTER — Ambulatory Visit (HOSPITAL_COMMUNITY)
Admission: RE | Admit: 2013-10-07 | Discharge: 2013-10-07 | Disposition: A | Discharge status: Home / Self Care | Payer: Medicare Other | Source: Ambulatory Visit | Attending: Vascular Surgery | Admitting: Vascular Surgery

## 2013-10-07 DIAGNOSIS — E7800 Pure hypercholesterolemia, unspecified: Secondary | ICD-10-CM | POA: Insufficient documentation

## 2013-10-07 DIAGNOSIS — L97209 Non-pressure chronic ulcer of unspecified calf with unspecified severity: Secondary | ICD-10-CM | POA: Insufficient documentation

## 2013-10-07 DIAGNOSIS — Z7982 Long term (current) use of aspirin: Secondary | ICD-10-CM | POA: Insufficient documentation

## 2013-10-07 DIAGNOSIS — F17200 Nicotine dependence, unspecified, uncomplicated: Secondary | ICD-10-CM | POA: Insufficient documentation

## 2013-10-07 DIAGNOSIS — I739 Peripheral vascular disease, unspecified: Secondary | ICD-10-CM

## 2013-10-07 DIAGNOSIS — N289 Disorder of kidney and ureter, unspecified: Secondary | ICD-10-CM | POA: Insufficient documentation

## 2013-10-07 DIAGNOSIS — L98499 Non-pressure chronic ulcer of skin of other sites with unspecified severity: Secondary | ICD-10-CM

## 2013-10-07 DIAGNOSIS — G8929 Other chronic pain: Secondary | ICD-10-CM | POA: Insufficient documentation

## 2013-10-07 DIAGNOSIS — K7460 Unspecified cirrhosis of liver: Secondary | ICD-10-CM | POA: Insufficient documentation

## 2013-10-07 DIAGNOSIS — I10 Essential (primary) hypertension: Secondary | ICD-10-CM | POA: Insufficient documentation

## 2013-10-07 DIAGNOSIS — E119 Type 2 diabetes mellitus without complications: Secondary | ICD-10-CM | POA: Insufficient documentation

## 2013-10-07 DIAGNOSIS — Z794 Long term (current) use of insulin: Secondary | ICD-10-CM | POA: Insufficient documentation

## 2013-10-07 DIAGNOSIS — F329 Major depressive disorder, single episode, unspecified: Secondary | ICD-10-CM | POA: Insufficient documentation

## 2013-10-07 DIAGNOSIS — B1920 Unspecified viral hepatitis C without hepatic coma: Secondary | ICD-10-CM | POA: Insufficient documentation

## 2013-10-07 DIAGNOSIS — M545 Low back pain, unspecified: Secondary | ICD-10-CM | POA: Insufficient documentation

## 2013-10-07 HISTORY — PX: ABDOMINAL AORTAGRAM: SHX5454

## 2013-10-07 LAB — POCT I-STAT, CHEM 8
BUN: 36 mg/dL — AB (ref 6–23)
CHLORIDE: 110 meq/L (ref 96–112)
Calcium, Ion: 1.24 mmol/L — ABNORMAL HIGH (ref 1.12–1.23)
Creatinine, Ser: 2.5 mg/dL — ABNORMAL HIGH (ref 0.50–1.35)
Glucose, Bld: 112 mg/dL — ABNORMAL HIGH (ref 70–99)
HCT: 29 % — ABNORMAL LOW (ref 39.0–52.0)
HEMOGLOBIN: 9.9 g/dL — AB (ref 13.0–17.0)
Potassium: 5.1 mEq/L (ref 3.7–5.3)
SODIUM: 142 meq/L (ref 137–147)
TCO2: 21 mmol/L (ref 0–100)

## 2013-10-07 LAB — GLUCOSE, CAPILLARY: Glucose-Capillary: 102 mg/dL — ABNORMAL HIGH (ref 70–99)

## 2013-10-07 SURGERY — ABDOMINAL AORTAGRAM
Anesthesia: LOCAL

## 2013-10-07 MED ORDER — HYDRALAZINE HCL 20 MG/ML IJ SOLN
INTRAMUSCULAR | Status: AC
  Filled 2013-10-07: qty 1

## 2013-10-07 MED ORDER — SODIUM CHLORIDE 0.9 % IV SOLN: 1.0000 mL/kg/h | INTRAVENOUS | Status: DC

## 2013-10-07 MED ORDER — MIDAZOLAM HCL 2 MG/2ML IJ SOLN
INTRAMUSCULAR | Status: AC
  Filled 2013-10-07: qty 2

## 2013-10-07 MED ORDER — FENTANYL CITRATE 0.05 MG/ML IJ SOLN
INTRAMUSCULAR | Status: AC
  Filled 2013-10-07: qty 2

## 2013-10-07 MED ORDER — HYDRALAZINE HCL 20 MG/ML IJ SOLN: 10.0000 mg | INTRAMUSCULAR | Status: DC | PRN

## 2013-10-07 MED ORDER — HEPARIN (PORCINE) IN NACL 2-0.9 UNIT/ML-% IJ SOLN
INTRAMUSCULAR | Status: AC
  Filled 2013-10-07: qty 1000

## 2013-10-07 MED ORDER — SODIUM CHLORIDE 0.9 % IV SOLN
INTRAVENOUS | Status: DC
  Administered 2013-10-07: 08:00:00 via INTRAVENOUS

## 2013-10-07 MED ORDER — LIDOCAINE HCL (PF) 1 % IJ SOLN
INTRAMUSCULAR | Status: AC
  Filled 2013-10-07: qty 30

## 2013-10-07 NOTE — Discharge Instructions (Signed)
Angiography, Care After °Refer to this sheet in the next few weeks. These instructions provide you with information on caring for yourself after your procedure. Your health care provider may also give you more specific instructions. Your treatment has been planned according to current medical practices, but problems sometimes occur. Call your health care provider if you have any problems or questions after your procedure.  °WHAT TO EXPECT AFTER THE PROCEDURE °After your procedure, it is typical to have the following sensations: °· Minor discomfort or tenderness and a small bump at the catheter insertion site. The bump should usually decrease in size and tenderness within 1 to 2 weeks. °· Any bruising will usually fade within 2 to 4 weeks. °HOME CARE INSTRUCTIONS  °· You may need to keep taking blood thinners if they were prescribed for you. Only take over-the-counter or prescription medicines for pain, fever, or discomfort as directed by your health care provider. °· Do not apply powder or lotion to the site. °· Do not sit in a bathtub, swimming pool, or whirlpool for 5 to 7 days. °· You may shower 24 hours after the procedure. Remove the bandage (dressing) and gently wash the site with plain soap and water. Gently pat the site dry. °· Inspect the site at least twice daily. °· Limit your activity for the first 24 hours. Do not bend, squat, or lift anything over 10 lb (9 kg) or as directed by your health care provider. °· Do not drive home if you are discharged the day of the procedure. Have someone else drive you. Follow instructions about when you can drive or return to work. °SEEK MEDICAL CARE IF: °· You get lightheaded when standing up. °· You have drainage (other than a small amount of blood on the dressing). °· You have chills. °· You have a fever. °· You have redness, warmth, swelling, or pain at the insertion site. °SEEK IMMEDIATE MEDICAL CARE IF:  °· You develop chest pain or shortness of breath, feel faint,  or pass out. °· You have bleeding, swelling larger than a walnut, or drainage from the catheter insertion site. °· You develop pain, discoloration, coldness, or severe bruising in the leg or arm that held the catheter. °· You have heavy bleeding from the site. If this happens, hold pressure on the site and call 911. °MAKE SURE YOU: °· Understand these instructions. °· Will watch your condition. °· Will get help right away if you are not doing well or get worse. °Document Released: 02/24/2005 Document Revised: 04/10/2013 Document Reviewed: 12/31/2012 °[employer]® Patient Information ©2014 [employer]. ° °

## 2013-10-07 NOTE — Interval H&P Note (Signed)
History and Physical Interval Note:  10/07/2013 9:02 AM  Carl Browning  has presented today for surgery, with the diagnosis of PVD  The various methods of treatment have been discussed with the patient and family. After consideration of risks, benefits and other options for treatment, the patient has consented to  Procedure(s): ABDOMINAL AORTAGRAM (N/A) as a surgical intervention .  The patient's history has been reviewed, patient examined, no change in status, stable for surgery.  I have reviewed the patient's chart and labs.  Questions were answered to the patient's satisfaction.     Shatiqua Heroux S

## 2013-10-07 NOTE — Op Note (Signed)
   PATIENT: Carl Browning    MRN: [id number] DOB: [birth year]    DATE OF PROCEDURE: 10/07/2013  INDICATIONS: Carl NajjarJoe Lorincz is a 58 y.o. male presents with a wound on his right pretibial area and also rest pain bilaterally. His creatinine was 2.5. He has a history of mild renal insufficiency.  PROCEDURE:  1. Ultrasound-guided access to the left common femoral artery 2. Aortogram with CO2 3. Selective catheterization of the right external iliac artery and also right superficial femoral artery  4. Right lower extremity runoff using CO2.. 5. Retrograde left femoral arteriogram with left lower extremity runoff using CO2  SURGEON: Di Kindlehristopher S. Edilia Boickson, MD, FACS  ANESTHESIA: local with sedation   EBL: minimal  TECHNIQUE: The patient was taken to the peripheral vascular lab and received 1 mg of Versed and 50 mcg of fentanyl. In addition he received 20 mg of hydralazine for blood pressure control. Both groins were prepped and draped in usual sterile fashion. After the skin was anesthetized with 1 to lidocaine, and under ultrasound guidance, the left common femoral artery was cannulated and a guidewire introduced into the infrarenal aorta under fluoroscopic control. A 5 French sheath was introduced over the wire. The pigtail catheter was positioned at the L1 vertebral body and flush aortogram obtained using CO2. The cath was exchanged for a crossover catheter which was positioned into the proximal right common iliac artery. An angled Glidewire was advanced down into the common femoral artery and the crossover catheter exchange for an endhole catheter. Selective right external iliac arteriogram was obtained with CO2. I did advance the wire into the superficial femoral artery and advance the endhole catheter over the wire into the superficial femoral artery. Selective right superficial femoral artery arteriogram was obtained. I was unable to get adequate visualization of the tibial vessels and therefore did one spot  film using 12 cc of contrast on the right. The endhole catheter was removed and then a retrograde left femoral CO2 arteriogram was obtained with left lower extremity runoff. At the completion of the procedure, the patient was transferred to the holding area for removal of the sheath.    FINDINGS:  1. Single renal arteries bilaterally with no significant infrarenal aortic occlusive disease. Both common iliac arteries, hypogastric arteries, and external iliac arteries are patent. 2. On the right side, which is the side with the ulcer, the common femoral and deep femoral artery are patent. The superficial femoral artery is patent although it does have diffuse calcific disease. The popliteal artery is patent. On the right side the anterior tibial and posterior tibial arteries are occluded. There is moderate diffuse disease of the tibial peroneal trunk and proximal peroneal artery. The distal peroneal on the right is patent. 3. On the left side, there is mild disease of the distal common femoral artery and proximal deep femoral artery. The superficial femoral artery has mild diffuse disease. The Potts artery is patent on the left. The anterior tibial and posterior tibial arteries are occluded on the left. The peroneal artery is opened.  Waverly Ferrarihristopher Dickson, MD, FACS [facility]  DATE OF DICTATION:   10/07/2013

## 2013-10-07 NOTE — H&P (View-Only) (Signed)
[facility]ascular and Vein Specialist of [facility]  Patient name: Carl Browning MRN: [id number] DOB: February 10, 1956 Sex: male  REASON FOR CONSULT: pretibial wound right leg  HPI: Carl Browning is a 59 y.o. male who apparently developed a blister on his right leg approximately 3 months ago. This developed into a pretibial wound which has been slow to heal. He was sent for vascular consultation. He does describe bilateral lower extremity claudication. His symptoms are actually worse on the left side. He experiences calf pain which is brought on by ambulation and relieved with rest. He also describes some pain in both feet although this may be neuropathy from his diabetes. The wound of the right leg has shown little progress.  His risk factors for peripheral vascular disease include diabetes, hypertension, hypercholesterolemia, premature cardiovascular disease, and tobacco use.   Past Medical History  Diagnosis Date  . CHF (congestive heart failure)   . Hypertension   . DM (diabetes mellitus), type 2, uncontrolled with complications   . Cirrhosis   . Anginal pain   . Hypercholesteremia   . Exertional dyspnea   . Hepatitis C     "took tx for it in 2001" (03/05/2013)  . Depression   . Kidney stones   . Chronic lower back pain     "that's why I'm on disability; hurt my back years ago" (03/05/2013)  . Ulcer     right lower leg   Family History  Problem Relation Age of Onset  . Heart attack Mother   . Prostate cancer Father    SOCIAL HISTORY: History  Substance Use Topics  . Smoking status: Current Every Day Smoker -- 0.12 packs/day for 36 years    Types: Cigarettes  . Smokeless tobacco: Never Used  . Alcohol Use: No     Comment: 03/05/2013 "Used to drink alcohol heavily, now rarely; don't buy it; might have a beer or glass of wine twice/yr"   No Known Allergies Current Outpatient Prescriptions  Medication Sig Dispense Refill  . carvedilol (COREG) 25 MG tablet Take 25 mg by mouth 2 (two) times daily  with a meal.      . clopidogrel (PLAVIX) 75 MG tablet Take 75 mg by mouth daily with breakfast.      . furosemide (LASIX) 40 MG tablet Take 1 tablet (40 mg total) by mouth 2 (two) times daily.  60 tablet  0  . insulin aspart (NOVOLOG FLEXPEN) 100 UNIT/ML FlexPen Inject 100 Units into the skin 3 (three) times daily with meals.      . insulin detemir (LEVEMIR) 100 UNIT/ML injection Inject 100 Units into the skin at bedtime.      . insulin glargine (LANTUS) 100 UNIT/ML injection Inject 0.18 mLs (18 Units total) into the skin at bedtime.  10 mL  1  . lisinopril (PRINIVIL,ZESTRIL) 20 MG tablet Take 20 mg by mouth daily.      . methadone (DOLOPHINE) 10 MG tablet Take 1 tablet (10 mg total) by mouth 2 (two) times daily.  20 tablet  0  . omeprazole (PRILOSEC) 20 MG capsule Take 20 mg by mouth daily.      . QUEtiapine (SEROQUEL) 50 MG tablet Take 50 mg by mouth at bedtime.      . sertraline (ZOLOFT) 25 MG tablet Take 1 tablet (25 mg total) by mouth daily.  30 tablet  0  . spironolactone (ALDACTONE) 50 MG tablet Take 1 tablet (50 mg total) by mouth daily.  30 tablet  0   No current  facility-administered medications for this visit.   REVIEW OF SYSTEMS: Arly.Keller ] denotes positive finding; [  ] denotes negative finding  CARDIOVASCULAR:  [ ]  chest pain   [ ]  chest pressure   [ ]  palpitations   [ ]  orthopnea   Arly.Keller ] dyspnea on exertion   Arly.Keller ] claudication   Arly.Keller ] rest pain   [ ]  DVT   [ ]  phlebitis PULMONARY:   [ ]  productive cough   [ ]  asthma   [ ]  wheezing NEUROLOGIC:   [ ]  weakness  [ ]  paresthesias  [ ]  aphasia  [ ]  amaurosis  [ ]  dizziness HEMATOLOGIC:   [ ]  bleeding problems   [ ]  clotting disorders MUSCULOSKELETAL:  [ ]  joint pain   [ ]  joint swelling Arly.Keller ] leg swelling GASTROINTESTINAL: [ ]   blood in stool  [ ]   hematemesis GENITOURINARY:  [ ]   dysuria  [ ]   hematuria PSYCHIATRIC:  Arly.Keller ] history of major depression INTEGUMENTARY:  [ ]  rashes  Arly.Keller ] ulcers CONSTITUTIONAL:  [ ]  fever   [ ]  chills  PHYSICAL  EXAM: Filed Vitals:   10/02/13 1406  BP: 188/73  Pulse: 102  Resp: 16  Height: 5\' 11"  (1.803 m)  Weight: 239 lb (108.41 kg)   Body mass index is 33.35 kg/(m^2). GENERAL: The patient is a well-nourished male, in no acute distress. The vital signs are documented above. CARDIOVASCULAR: There is a regular rate and rhythm. I do not detect carotid bruits. On the right side, which is asymptomatic side, he has a palpable femoral and popliteal pulse. I cannot palpate a dorsalis pedis or posterior tibial pulse. On the left side, he has a palpable femoral pulse. I cannot palpate a popliteal, dorsalis pedis, or posterior tibial pulse. He has mild bilateral lower extremity swelling. PULMONARY: There is good air exchange bilaterally without wheezing or rales. ABDOMEN: Soft and non-tender with normal pitched bowel sounds.  MUSCULOSKELETAL: There are no major deformities or cyanosis. NEUROLOGIC: No focal weakness or paresthesias are detected. SKIN: The pretibial wound on his right leg measures 1 cm in width by 3 cm in height. PSYCHIATRIC: The patient has a normal affect.  DATA:  I have independently interpreted his arterial Doppler study. He has a monophasic right posterior tibial and dorsalis pedis signal with an ABI of 77%. This may be falsely elevated. Toe pressure on the right is 26 mm of mercury. On the left side he has a monophasic peroneal and dorsalis pedis signal. There is no posterior tibial signal. His ABI on the left is 70%. This may be falsely elevated. Toe pressure on the left is 26 mm of mercury.  MEDICAL ISSUES:  Atherosclerotic PVD with ulceration This patient has a nonhealing wound on his right leg with evidence of tibial artery occlusive disease. I have recommended we proceed with arteriography to evaluate him for revascularization. I concerned that without revascularization this could potentially become a limb threatening problem given his diabetes and infrainguinal arterial occlusive  disease. I have reviewed with the patient the indications for arteriography. In addition, I have reviewed the potential complications of arteriography including but not limited to: Bleeding, arterial injury, arterial thrombosis, dye action, renal insufficiency, or other unpredictable medical problems. I have explained to the patient that if we find disease amenable to angioplasty we could potentially address this at the same time. I have discussed the potential complications of angioplasty and stenting, including but not limited to: Bleeding, arterial thrombosis, arterial injury,  dissection, or the need for surgical intervention.    DICKSON,CHRISTOPHER S [facility] [place] [facility]: [phone]

## 2013-10-10 ENCOUNTER — Encounter: Payer: Self-pay | Admitting: Vascular Surgery

## 2013-10-24 ENCOUNTER — Encounter (HOSPITAL_BASED_OUTPATIENT_CLINIC_OR_DEPARTMENT_OTHER): Payer: Medicare Other | Attending: Internal Medicine

## 2013-11-05 ENCOUNTER — Encounter: Payer: Self-pay | Admitting: Vascular Surgery

## 2013-11-06 ENCOUNTER — Encounter: Payer: Medicare Other | Admitting: Vascular Surgery

## 2014-02-27 IMAGING — CR DG CHEST 2V
2 series · 2 of 2 positions shown · non-contrast
Comparison: None.

CLINICAL DATA: Chest pain

CHEST - 2 VIEW

[w chest pa]
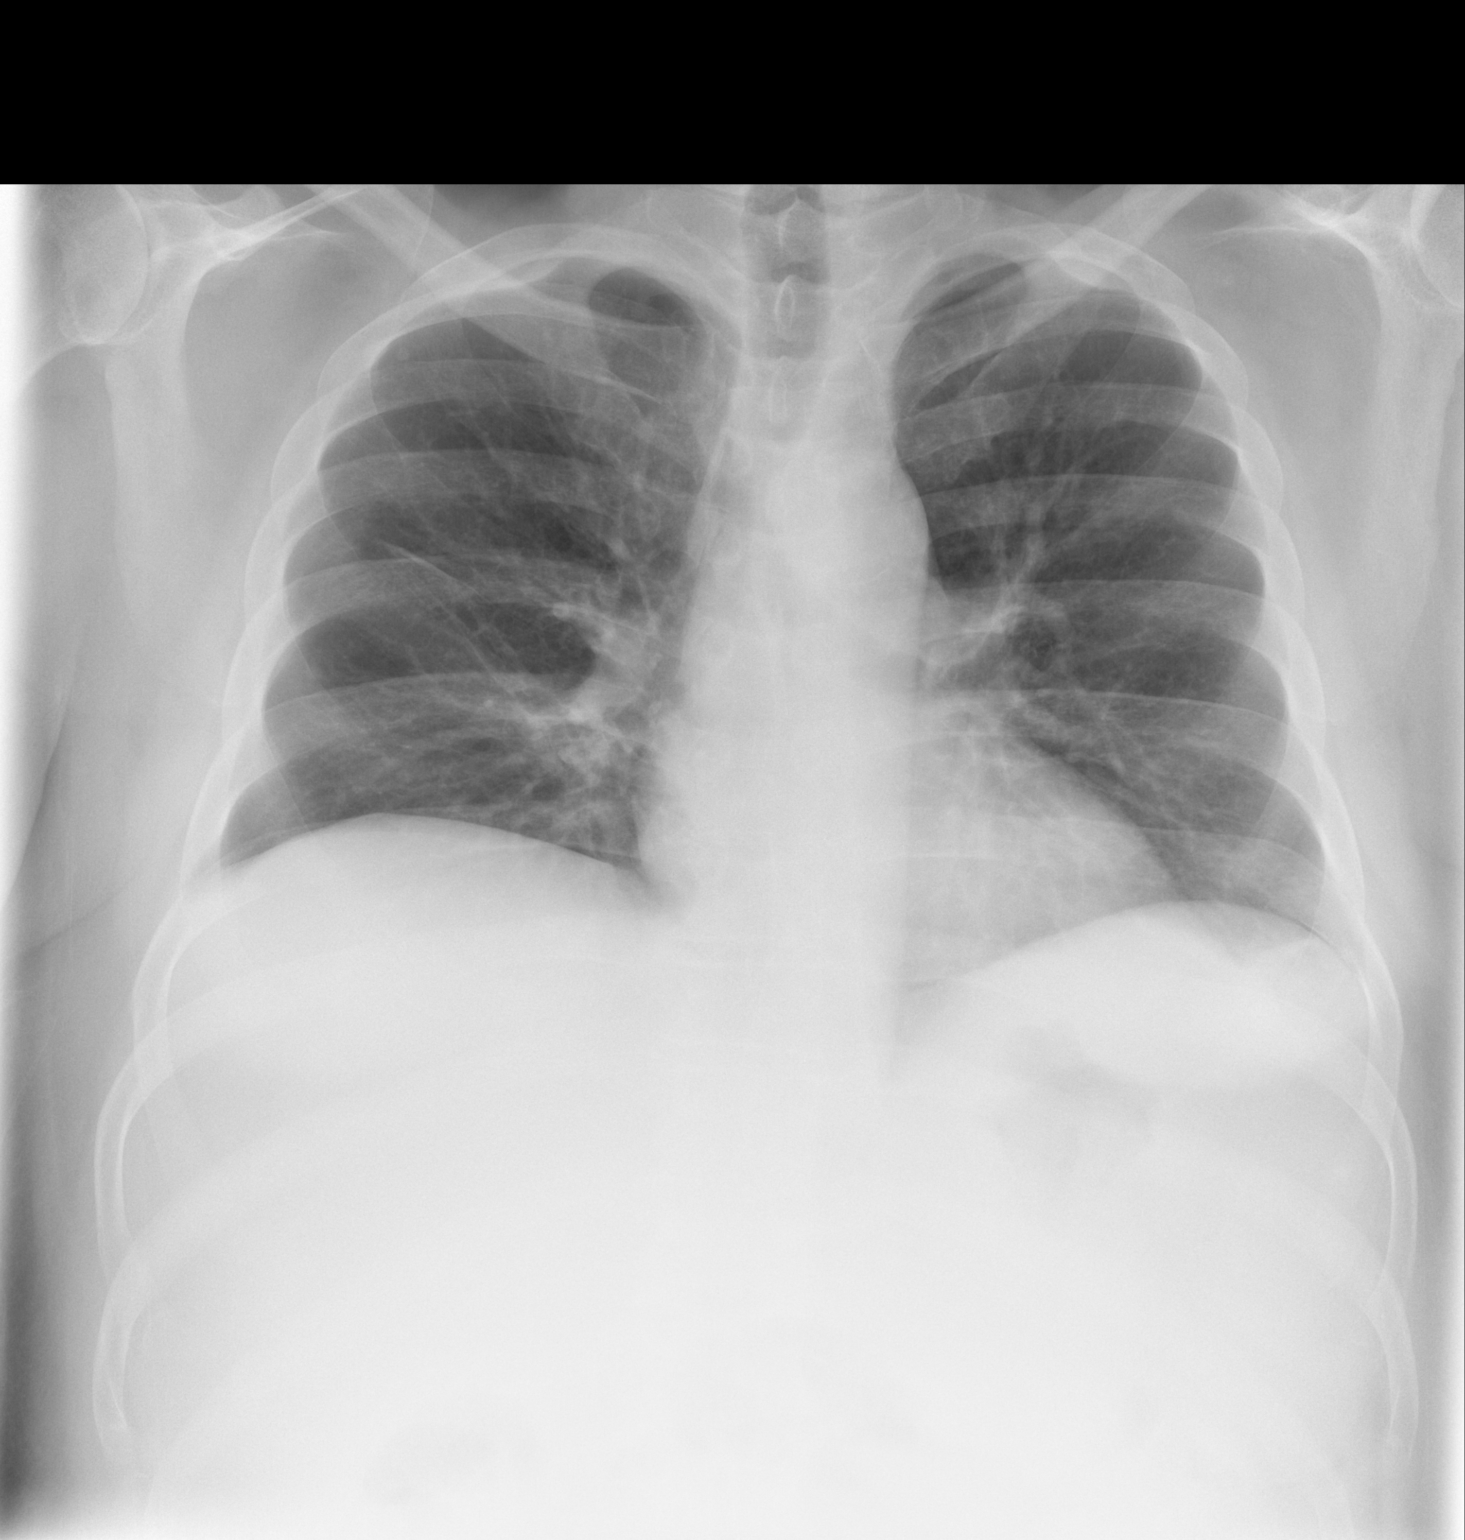

[w chest lat *]
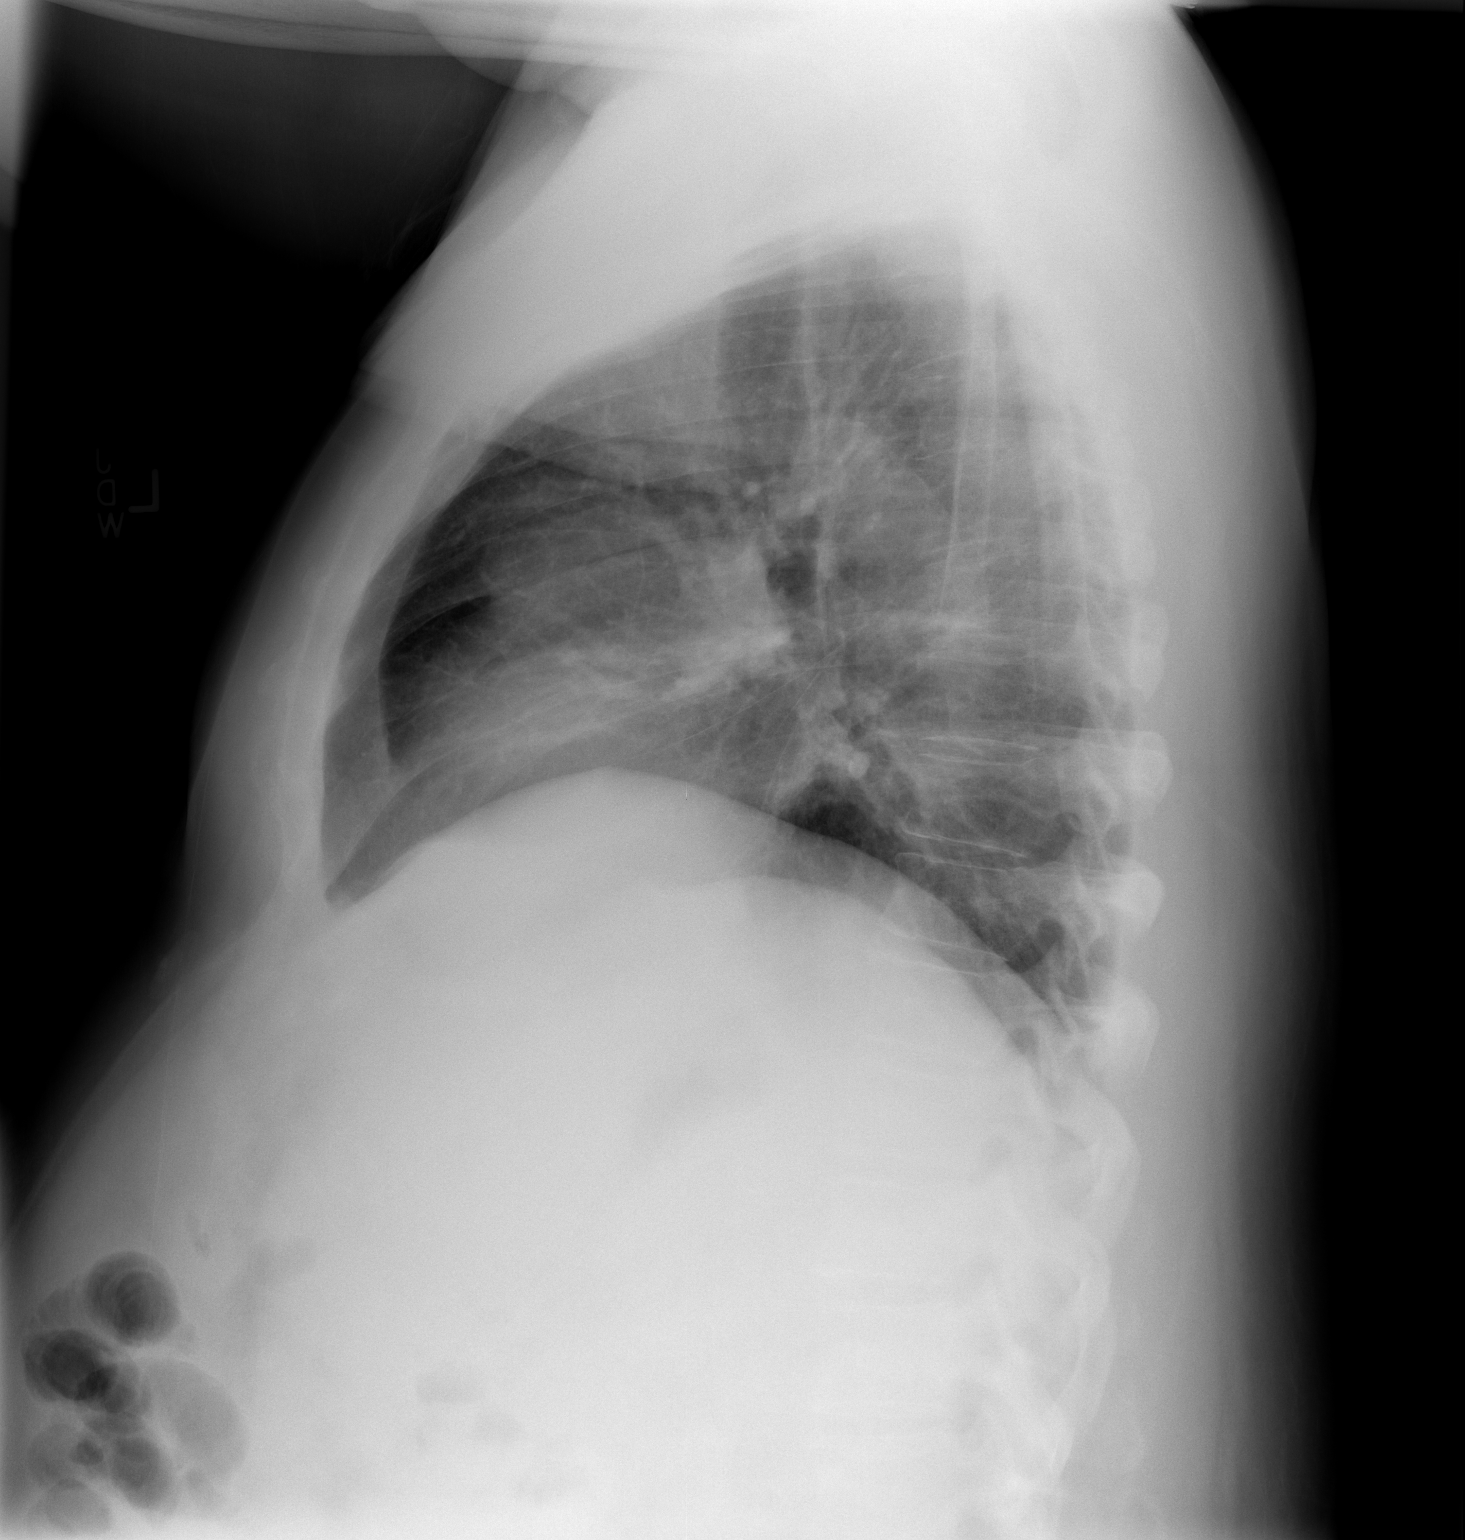

[2 of 2 positions shown; findings below may reference images not displayed]

FINDINGS: Cardiomediastinal silhouette is unremarkable.  No acute
infiltrate or pleural effusion.  No pulmonary edema.  Bony thorax
is unremarkable.
IMPRESSION: No active disease.

## 2014-02-28 IMAGING — US US ABDOMEN COMPLETE
1 series · 13 of 25 positions shown · non-contrast
Comparison: None.

CLINICAL DATA: Cirrhosis and acute renal failure.

COMPLETE ABDOMINAL ULTRASOUND

[Series 1: us abdomen complete · 0.36mm/px · 13 of 110 slices shown]
[im 1/110]
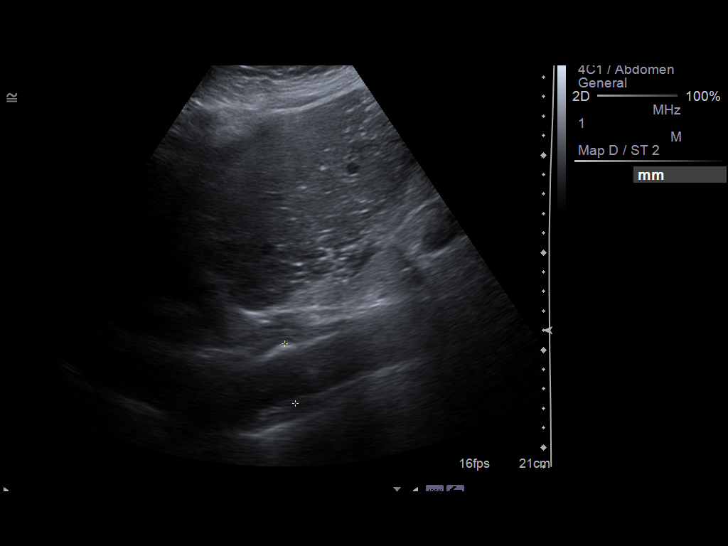
[im 10/110]
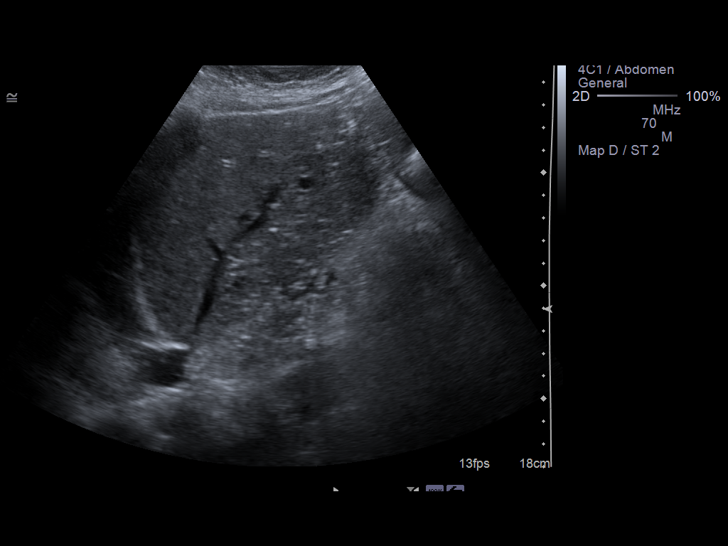
[im 19/110]
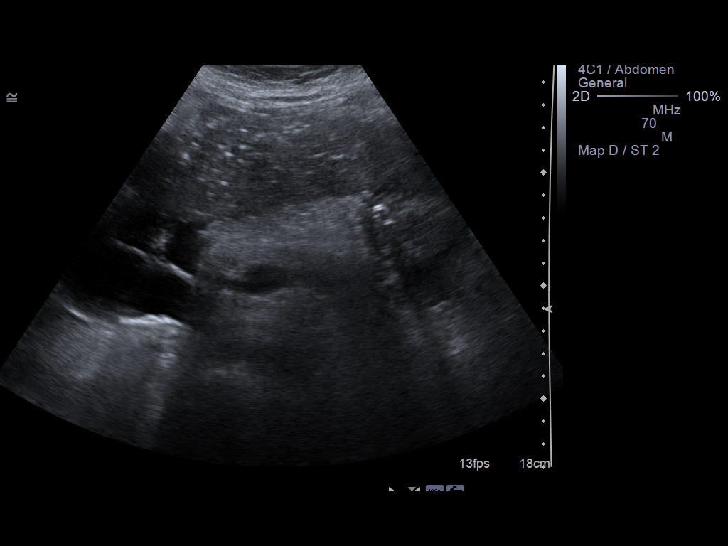
[im 28/110]
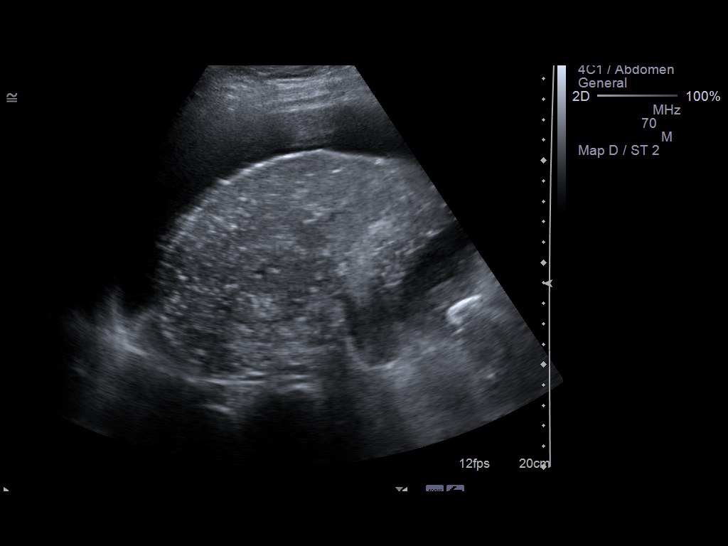
[im 37/110]
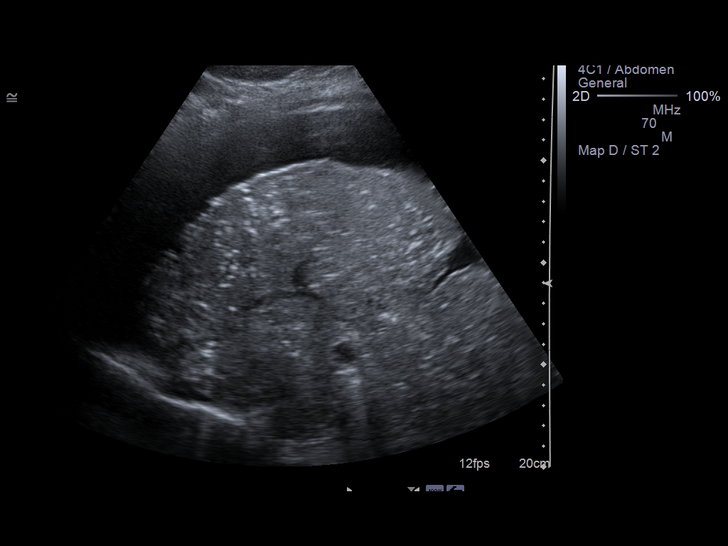
[im 46/110]
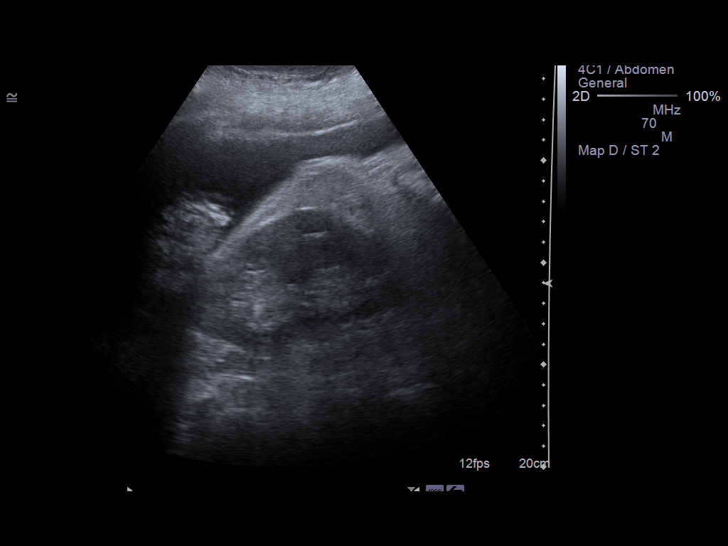
[im 55/110]
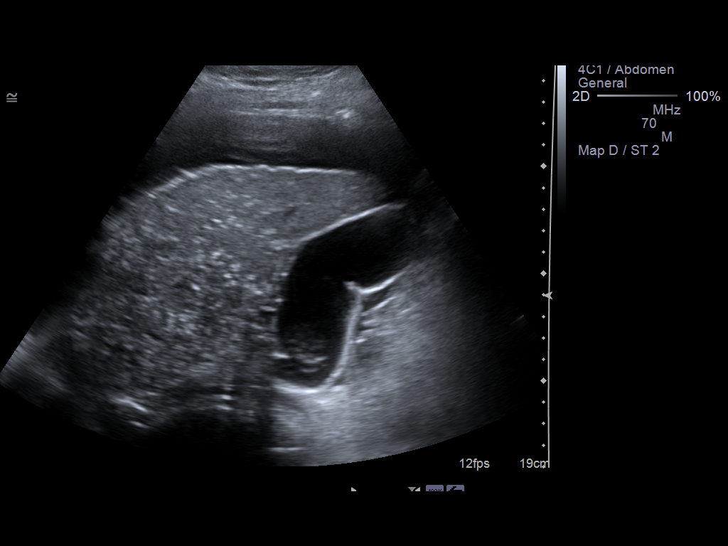
[im 64/110]
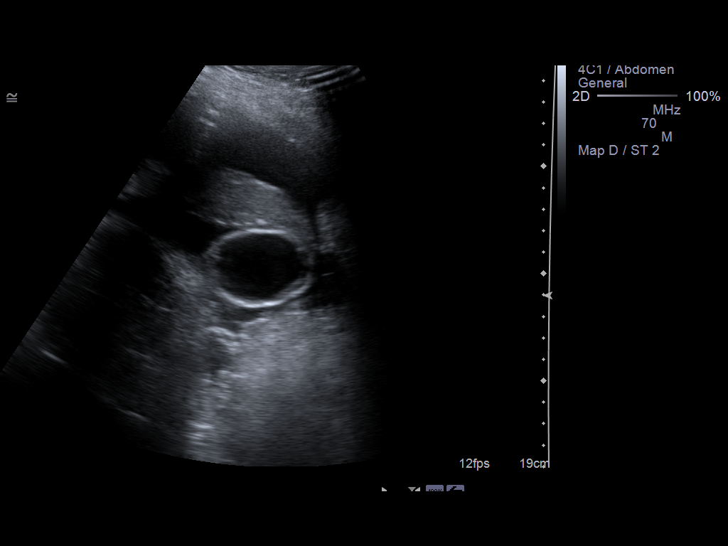
[im 73/110]
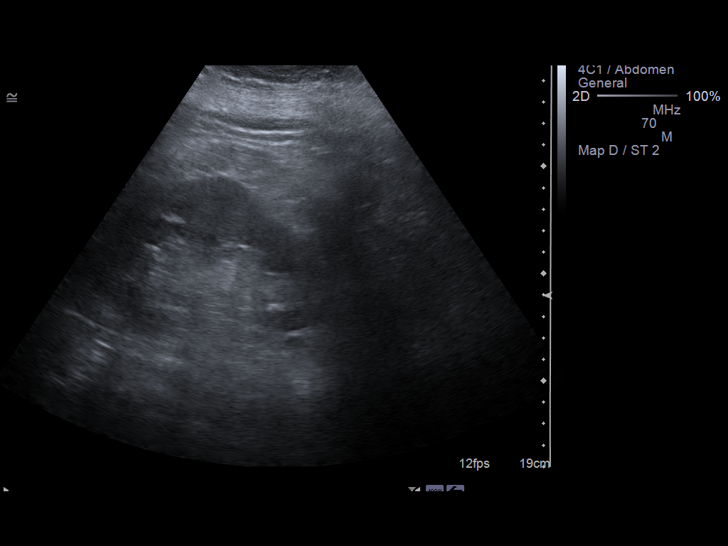
[im 82/110]
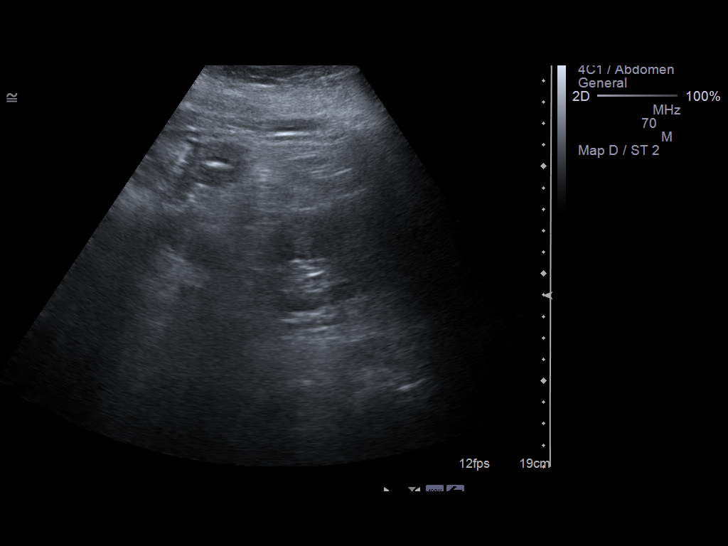
[im 91/110]
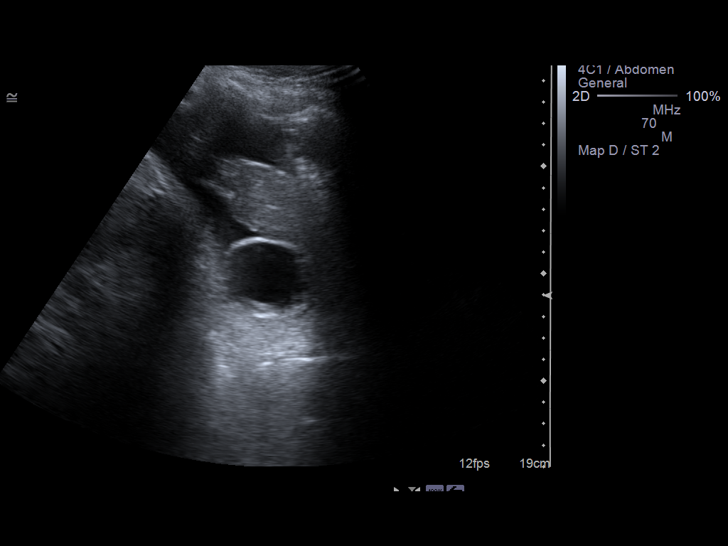
[im 100/110]
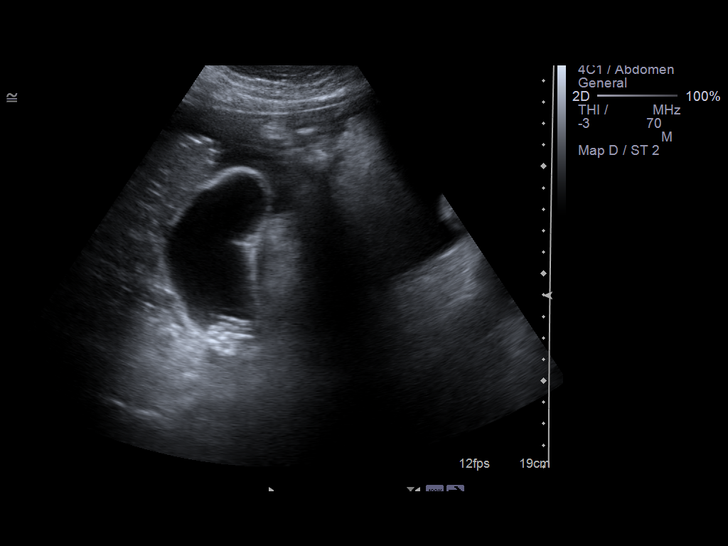
[im 110/110]
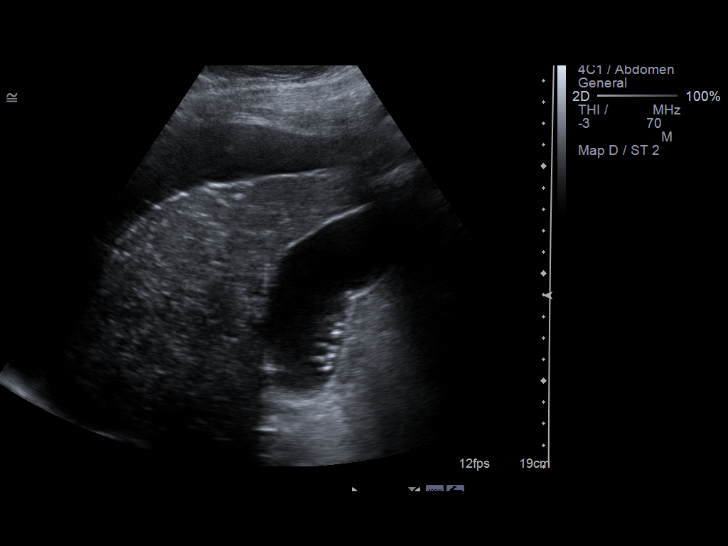

[13 of 25 positions shown; findings below may reference images not displayed]

FINDINGS: Gallbladder:  Numerous mobile echogenic shadowing gallstones are
present.  Wall thickness is within normal limits at 3 mm.  There is
no sonographic Murphy's sign.

Common bile duct:  The common bile duct is at the limits of normal
in diameter at 6.3 mm.

Liver:  The liver is shrunken with multiple echogenic foci.  No
discrete mass lesion is present.  The edges are nodular, compatible
with end-stage cirrhosis.

IVC:  Appears normal.

Pancreas:  Although the pancreas is difficult to visualize in its
entirety, no focal pancreatic abnormality is identified.

Spleen:  The spleen is mildly enlarged, measuring 1.6 cm in maximal
length.  No focal lesions are present.

The right kidney is of normal size and echo texture.  It measures
12.5 cm maximally.  Multiple shadowing calcifications are present,
the largest measuring 7 mm.  There is no evidence for obstruction.

Right Kidney:  Measures

Left Kidney:  The left kidney is of normal size and echotexture
measuring 12.1 cm maximally.  Multiple calcifications are noted
without evidence for obstruction.  The largest stone is 8 mm.

Abdominal aorta:  The distal aorta is not visualized.  Minimal
aneurysmal dilation is evident with maximal measured diameter of
3.1 cm.

Moderate free fluid is present within the peritoneal cavity.
IMPRESSION: 1.  Cirrhotic changes of the liver with scattered increased foci of
echogenicity, overall decreased size, and a nodular edge.
2.  Moderate abdominal ascites.
3.  Enlarged spleen without a discrete lesion.
4.  Bilateral nephrolithiasis without obstruction.
5.  Mild aneurysmal dilation of the proximal abdominal aorta,
measuring up to 3.1 cm.

## 2014-03-02 IMAGING — US US PARACENTESIS
1 series · 9 of 9 positions shown · non-contrast
Comparison: None

CLINICAL DATA: Abdominal ascites

ULTRASOUND GUIDED PARACENTESIS

[Series 1: us paracentesis · 0.32mm/px · 9 of 9 slices shown]
[im 1/9]
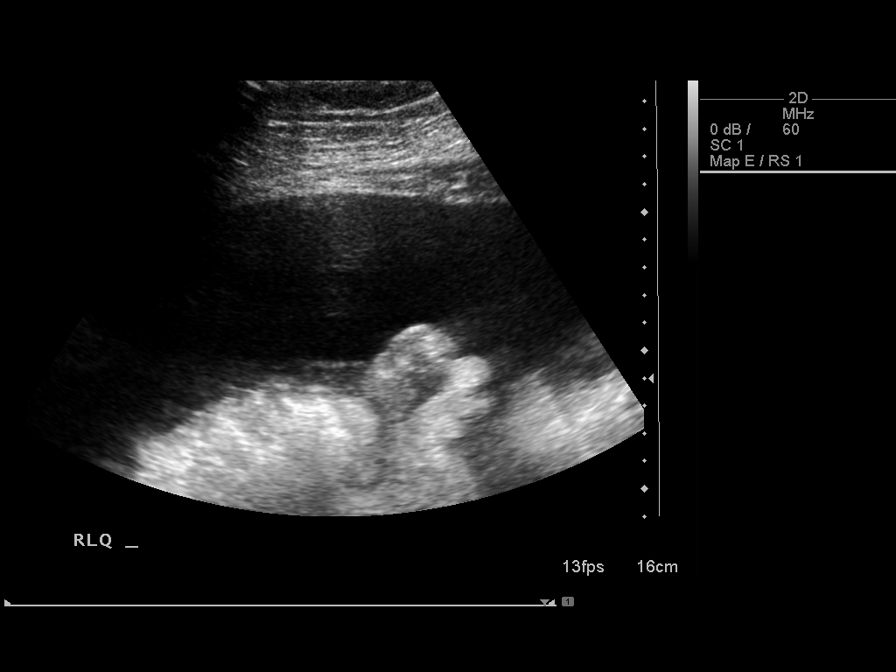
[im 2/9]
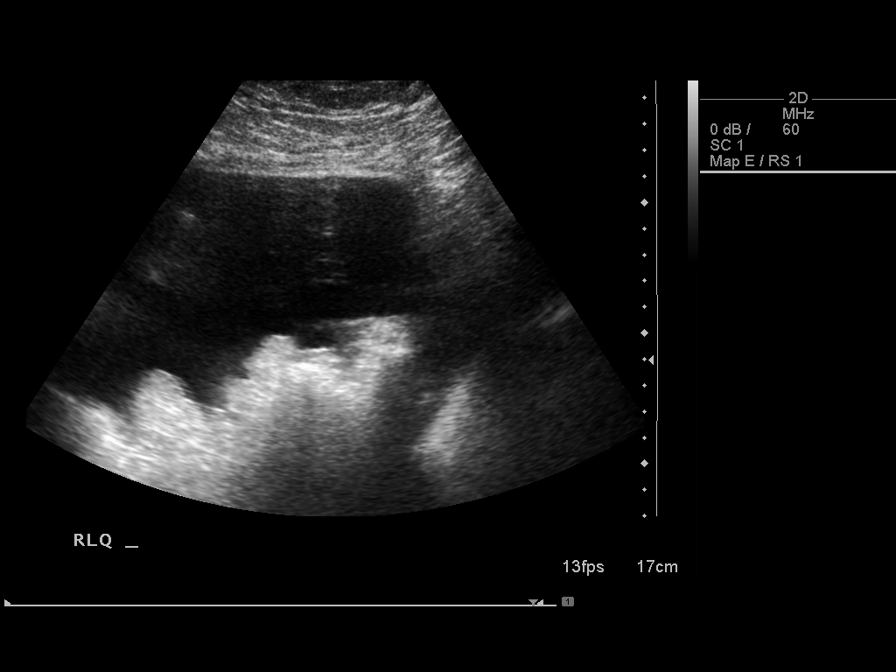
[im 3/9]
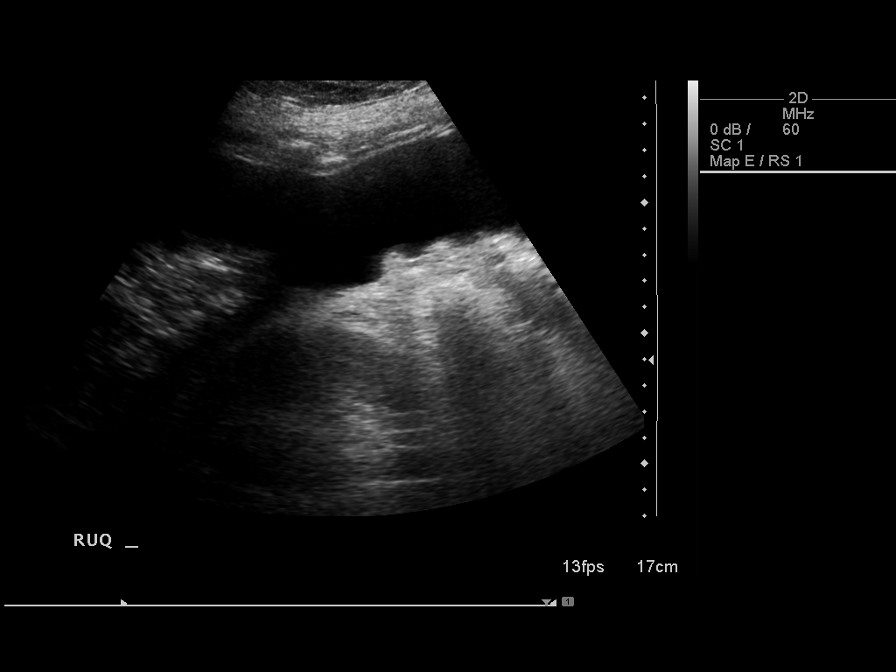
[im 4/9]
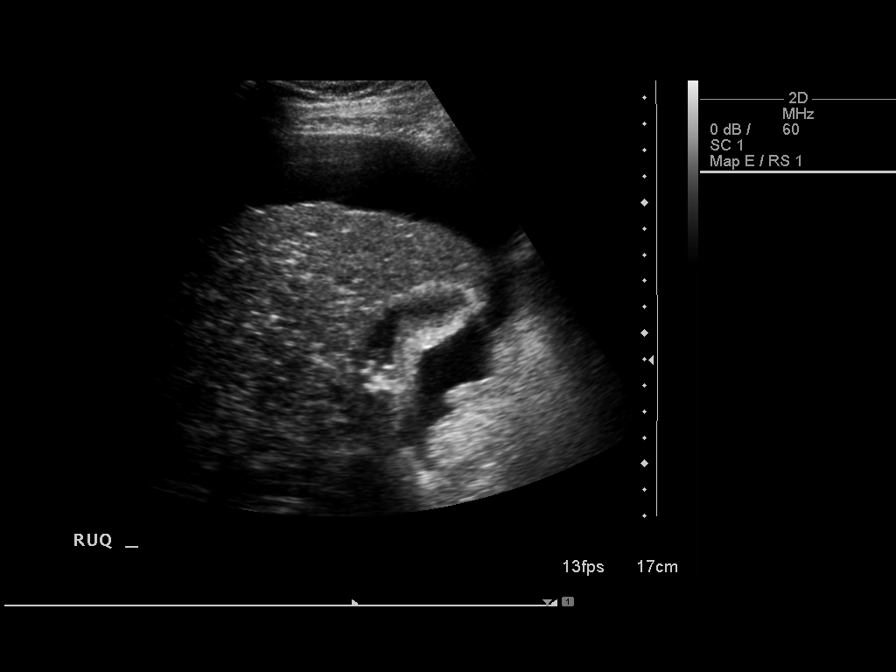
[im 5/9]
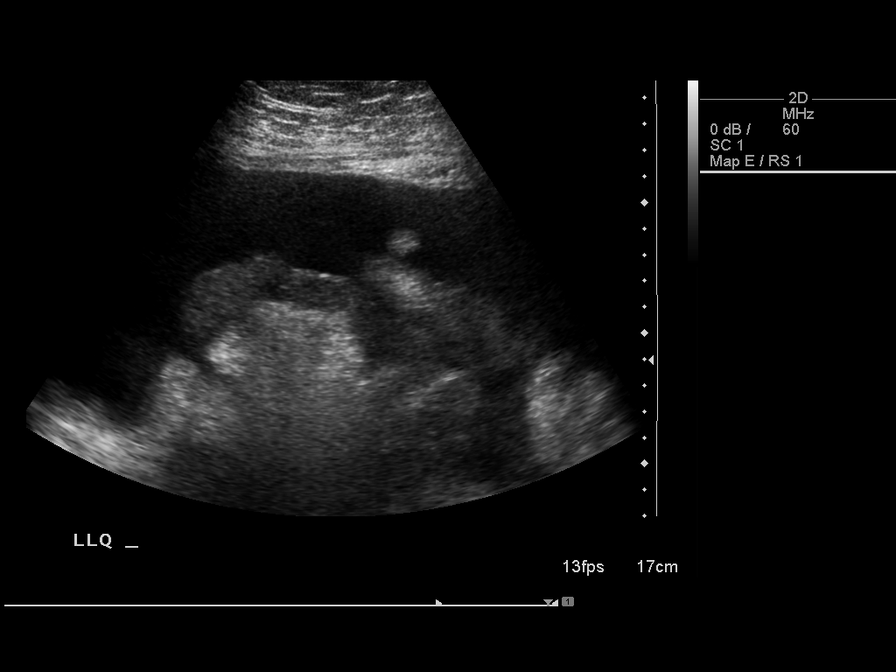
[im 6/9]
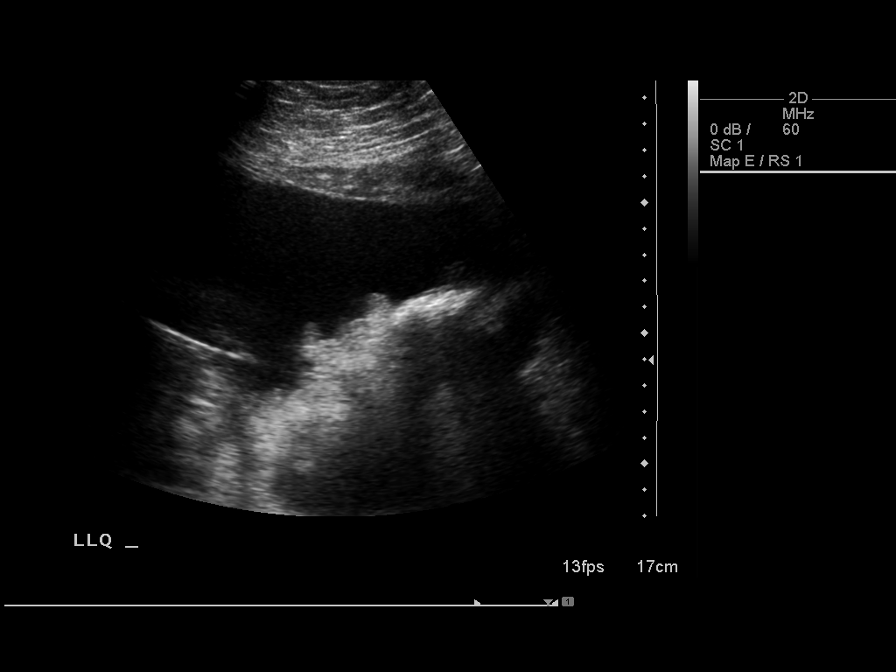
[im 7/9]
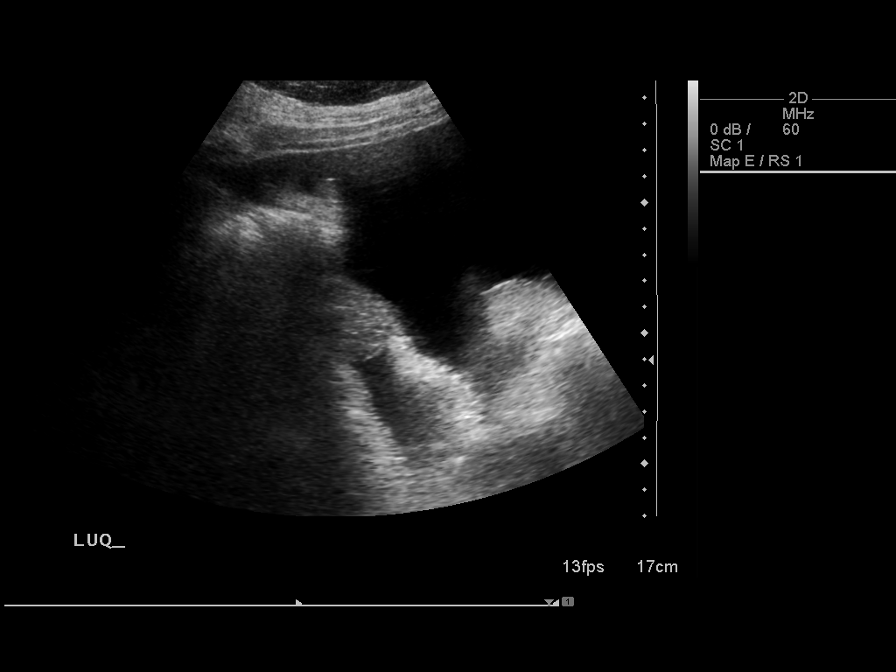
[im 8/9]
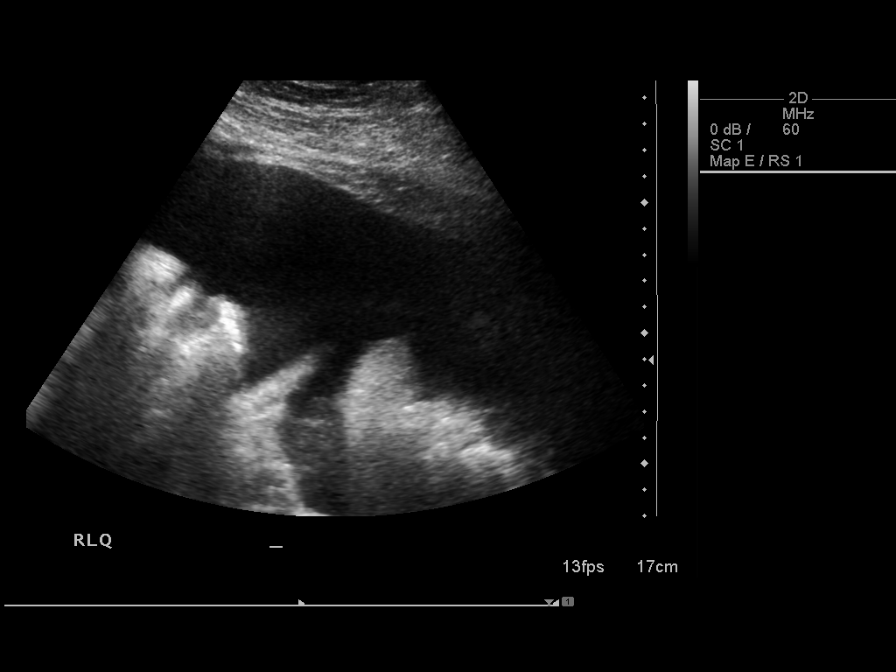
[im 9/9]
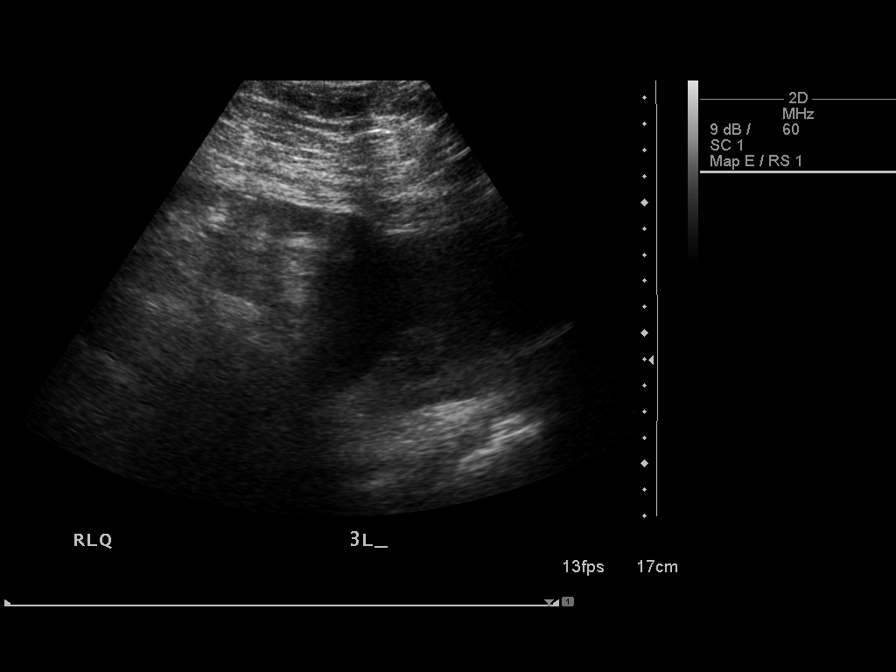

[9 of 9 positions shown; findings below may reference images not displayed]

An ultrasound guided paracentesis was thoroughly discussed with the
patient and questions answered.  The benefits, risks, alternatives
and complications were also discussed.  The patient understands and
wishes to proceed with the procedure.  Written consent was
obtained.

Ultrasound was performed to localize and mark an adequate pocket of
fluid in the right lower quadrant of the abdomen.  The area was
then prepped and draped in the normal sterile fashion.  1%
Lidocaine was used for local anesthesia.  Under ultrasound guidance
a 19 gauge Yueh catheter was introduced.  Paracentesis was
performed.  The catheter was removed and a dressing applied.

Complications:  None
FINDINGS: A total of approximately 3 liters of cloudy yellow fluid
was removed.  A fluid sample was sent for laboratory analysis.
IMPRESSION: Successful ultrasound guided paracentesis yielding 3 liters of
ascites.

Read by: Oshi, Hajja Hauwa.-ANDRONIQI

## 2014-03-22 DEATH — deceased

## 2014-07-31 ENCOUNTER — Encounter (HOSPITAL_COMMUNITY): Payer: Self-pay | Admitting: Vascular Surgery
